# Patient Record
Sex: Female | Born: 1955 | Race: White | Hispanic: No | Marital: Married | Smoking: Never smoker
Health system: Southern US, Community
[De-identification: ages and names within clinical notes are randomized; demographics above are authoritative.]

## PROBLEM LIST (undated history)

## (undated) DIAGNOSIS — I10 Essential (primary) hypertension: Secondary | ICD-10-CM

## (undated) DIAGNOSIS — R7303 Prediabetes: Secondary | ICD-10-CM

## (undated) DIAGNOSIS — E039 Hypothyroidism, unspecified: Secondary | ICD-10-CM

## (undated) DIAGNOSIS — Z8489 Family history of other specified conditions: Secondary | ICD-10-CM

## (undated) DIAGNOSIS — M1990 Unspecified osteoarthritis, unspecified site: Secondary | ICD-10-CM

## (undated) DIAGNOSIS — E785 Hyperlipidemia, unspecified: Secondary | ICD-10-CM

## (undated) DIAGNOSIS — D649 Anemia, unspecified: Secondary | ICD-10-CM

## (undated) HISTORY — PX: BREAST EXCISIONAL BIOPSY: SUR124

## (undated) HISTORY — PX: ADENOIDECTOMY: SUR15

## (undated) HISTORY — PX: WISDOM TOOTH EXTRACTION: SHX21

## (undated) HISTORY — PX: BREAST BIOPSY: SHX20

## (undated) HISTORY — DX: Hyperlipidemia, unspecified: E78.5

## (undated) HISTORY — PX: POLYPECTOMY: SHX149

## (undated) MED FILL — Iron Sucrose Inj 20 MG/ML (Fe Equiv): INTRAVENOUS | Qty: 10 | Status: AC

---
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
## 1993-04-15 HISTORY — PX: TUBAL LIGATION: SHX77

## 1997-11-08 ENCOUNTER — Other Ambulatory Visit: Admission: RE | Admit: 1997-11-08 | Discharge: 1997-11-08 | Payer: Self-pay

## 1999-03-22 ENCOUNTER — Encounter: Admission: RE | Admit: 1999-03-22 | Discharge: 1999-03-22 | Payer: Self-pay | Admitting: Obstetrics and Gynecology

## 1999-03-22 ENCOUNTER — Encounter: Payer: Self-pay | Admitting: Obstetrics and Gynecology

## 2000-06-27 ENCOUNTER — Encounter: Admission: RE | Admit: 2000-06-27 | Discharge: 2000-06-27 | Payer: Self-pay | Admitting: Obstetrics and Gynecology

## 2000-06-27 ENCOUNTER — Encounter: Payer: Self-pay | Admitting: Obstetrics and Gynecology

## 2000-07-02 ENCOUNTER — Encounter: Payer: Self-pay | Admitting: Obstetrics and Gynecology

## 2000-07-02 ENCOUNTER — Encounter: Admission: RE | Admit: 2000-07-02 | Discharge: 2000-07-02 | Payer: Self-pay | Admitting: Obstetrics and Gynecology

## 2002-04-05 ENCOUNTER — Encounter: Admission: RE | Admit: 2002-04-05 | Discharge: 2002-04-05 | Payer: Self-pay | Admitting: Obstetrics and Gynecology

## 2002-04-05 ENCOUNTER — Encounter: Payer: Self-pay | Admitting: Obstetrics and Gynecology

## 2003-04-18 ENCOUNTER — Encounter: Admission: RE | Admit: 2003-04-18 | Discharge: 2003-04-18 | Payer: Self-pay | Admitting: Obstetrics and Gynecology

## 2004-08-01 ENCOUNTER — Ambulatory Visit (HOSPITAL_COMMUNITY): Admission: RE | Admit: 2004-08-01 | Discharge: 2004-08-01 | Payer: Self-pay | Admitting: Obstetrics and Gynecology

## 2005-10-22 ENCOUNTER — Ambulatory Visit (HOSPITAL_COMMUNITY): Admission: RE | Admit: 2005-10-22 | Discharge: 2005-10-22 | Payer: Self-pay | Admitting: Obstetrics and Gynecology

## 2007-01-16 ENCOUNTER — Ambulatory Visit (HOSPITAL_COMMUNITY): Admission: RE | Admit: 2007-01-16 | Discharge: 2007-01-16 | Payer: Self-pay | Admitting: Obstetrics and Gynecology

## 2008-02-05 ENCOUNTER — Ambulatory Visit (HOSPITAL_COMMUNITY): Admission: RE | Admit: 2008-02-05 | Discharge: 2008-02-05 | Payer: Self-pay | Admitting: Family Medicine

## 2009-03-02 ENCOUNTER — Ambulatory Visit (HOSPITAL_COMMUNITY): Admission: RE | Admit: 2009-03-02 | Discharge: 2009-03-02 | Payer: Self-pay | Admitting: Family Medicine

## 2010-04-03 ENCOUNTER — Ambulatory Visit (HOSPITAL_COMMUNITY)
Admission: RE | Admit: 2010-04-03 | Discharge: 2010-04-03 | Payer: Self-pay | Source: Home / Self Care | Attending: Family Medicine | Admitting: Family Medicine

## 2010-05-06 ENCOUNTER — Encounter: Payer: Self-pay | Admitting: Obstetrics and Gynecology

## 2011-04-15 ENCOUNTER — Other Ambulatory Visit (HOSPITAL_COMMUNITY): Payer: Self-pay | Admitting: Family Medicine

## 2011-04-15 DIAGNOSIS — Z1231 Encounter for screening mammogram for malignant neoplasm of breast: Secondary | ICD-10-CM

## 2011-05-14 ENCOUNTER — Ambulatory Visit (HOSPITAL_COMMUNITY)
Admission: RE | Admit: 2011-05-14 | Discharge: 2011-05-14 | Disposition: A | Discharge status: Home / Self Care | Payer: BC Managed Care – PPO | Source: Ambulatory Visit | Attending: Family Medicine | Admitting: Family Medicine

## 2011-05-14 DIAGNOSIS — Z1231 Encounter for screening mammogram for malignant neoplasm of breast: Secondary | ICD-10-CM | POA: Insufficient documentation

## 2012-04-29 ENCOUNTER — Other Ambulatory Visit (HOSPITAL_COMMUNITY): Payer: Self-pay | Admitting: Family Medicine

## 2012-04-29 DIAGNOSIS — Z1231 Encounter for screening mammogram for malignant neoplasm of breast: Secondary | ICD-10-CM

## 2012-05-28 ENCOUNTER — Ambulatory Visit (HOSPITAL_COMMUNITY): Payer: BC Managed Care – PPO

## 2012-07-27 ENCOUNTER — Ambulatory Visit (HOSPITAL_COMMUNITY)
Admission: RE | Admit: 2012-07-27 | Discharge: 2012-07-27 | Disposition: A | Discharge status: Home / Self Care | Payer: BC Managed Care – PPO | Source: Ambulatory Visit | Attending: Family Medicine | Admitting: Family Medicine

## 2012-07-27 DIAGNOSIS — Z1231 Encounter for screening mammogram for malignant neoplasm of breast: Secondary | ICD-10-CM | POA: Insufficient documentation

## 2012-12-30 ENCOUNTER — Other Ambulatory Visit (HOSPITAL_COMMUNITY): Payer: Self-pay | Admitting: Family Medicine

## 2012-12-30 DIAGNOSIS — Z780 Asymptomatic menopausal state: Secondary | ICD-10-CM

## 2013-02-05 ENCOUNTER — Ambulatory Visit (HOSPITAL_COMMUNITY)
Admission: RE | Admit: 2013-02-05 | Discharge: 2013-02-05 | Disposition: A | Discharge status: Home / Self Care | Payer: BC Managed Care – PPO | Source: Ambulatory Visit | Attending: Family Medicine | Admitting: Family Medicine

## 2013-02-05 ENCOUNTER — Ambulatory Visit (HOSPITAL_COMMUNITY): Payer: BC Managed Care – PPO

## 2013-02-05 DIAGNOSIS — Z780 Asymptomatic menopausal state: Secondary | ICD-10-CM | POA: Insufficient documentation

## 2013-02-05 DIAGNOSIS — Z13820 Encounter for screening for osteoporosis: Secondary | ICD-10-CM | POA: Insufficient documentation

## 2013-08-03 ENCOUNTER — Other Ambulatory Visit (HOSPITAL_COMMUNITY): Payer: Self-pay | Admitting: Family Medicine

## 2013-08-03 DIAGNOSIS — Z1231 Encounter for screening mammogram for malignant neoplasm of breast: Secondary | ICD-10-CM

## 2013-08-19 ENCOUNTER — Ambulatory Visit (HOSPITAL_COMMUNITY)
Admission: RE | Admit: 2013-08-19 | Discharge: 2013-08-19 | Disposition: A | Discharge status: Home / Self Care | Payer: BC Managed Care – PPO | Source: Ambulatory Visit | Attending: Family Medicine | Admitting: Family Medicine

## 2013-08-19 DIAGNOSIS — Z1231 Encounter for screening mammogram for malignant neoplasm of breast: Secondary | ICD-10-CM

## 2014-03-15 HISTORY — PX: OTHER SURGICAL HISTORY: SHX169

## 2014-10-05 ENCOUNTER — Other Ambulatory Visit (HOSPITAL_COMMUNITY): Payer: Self-pay | Admitting: Family Medicine

## 2014-10-05 DIAGNOSIS — Z1231 Encounter for screening mammogram for malignant neoplasm of breast: Secondary | ICD-10-CM

## 2014-10-10 ENCOUNTER — Ambulatory Visit (HOSPITAL_COMMUNITY)
Admission: RE | Admit: 2014-10-10 | Discharge: 2014-10-10 | Disposition: A | Discharge status: Home / Self Care | Payer: BC Managed Care – PPO | Source: Ambulatory Visit | Attending: Family Medicine | Admitting: Family Medicine

## 2014-10-10 DIAGNOSIS — Z1231 Encounter for screening mammogram for malignant neoplasm of breast: Secondary | ICD-10-CM | POA: Insufficient documentation

## 2015-03-23 ENCOUNTER — Other Ambulatory Visit (HOSPITAL_COMMUNITY): Payer: Self-pay | Admitting: Orthopaedic Surgery

## 2015-03-23 ENCOUNTER — Other Ambulatory Visit (HOSPITAL_COMMUNITY): Payer: Self-pay | Admitting: *Deleted

## 2015-03-23 NOTE — Patient Instructions (Addendum)
Sophia MassyDebra L Adams  03/23/2015   Your procedure is scheduled on: 03-31-15  [facility]  [facility] to 3rd floor to  Short Stay Center at 530 AM.  Call this number if you have problems the morning of surgery [phone]   Remember: ONLY 1 PERSON MAY GO WITH YOU TO SHORT STAY TO GET  READY MORNING OF YOUR SURGERY.  Do not eat food or drink liquids :After Midnight.     Take these medicines the morning of surgery with A SIP OF WATER: Synthroid                               You may not have any metal on your body including hair pins and              piercings  Do not wear jewelry, make-up, lotions, powders or perfumes, deodorant             Do not wear nail polish.  Do not shave  48 hours prior to surgery.              Men may shave face and neck.   Do not bring valuables to the hospital.  IS NOT             RESPONSIBLE   FOR VALUABLES.  Contacts, dentures or bridgework may not be worn into surgery.  Leave suitcase in the car. After surgery it may be brought to your room.      _____________________________________________________________________             [facility] Pre[facility] Before surgery, you can play an important role.  Because skin is not sterile, your skin needs to be as free of germs as possible.  You can reduce the number of germs on your skin by washing with CHG (chlorahexidine gluconate) soap before surgery.  CHG is an antiseptic cleaner which kills germs and bonds with the skin to continue killing germs even after washing. Please DO NOT use if you have an allergy to CHG or antibacterial soaps.  If your skin becomes reddened/irritated stop using the CHG and inform your nurse when you arrive at Short Stay. Do not shave (including legs and underarms) for at least 48 hours prior to the first CHG shower.  You may shave your face/neck. Please follow these instructions carefully:  1.  Shower with CHG  Soap the night before surgery and the  morning of Surgery.  2.  If you choose to wash your hair, wash your hair first as usual with your  normal  shampoo.  3.  After you shampoo, rinse your hair and body thoroughly to remove the  shampoo.                           4.  Use CHG as you would any other liquid soap.  You can apply chg directly  to the skin and wash                       Gently with a scrungie or clean washcloth.  5.  Apply the CHG Soap to your body ONLY FROM THE NECK DOWN.   Do not use on face/ open  Wound or open sores. Avoid contact with eyes, ears mouth and genitals (private parts).                       Wash face,  Genitals (private parts) with your normal soap.             6.  Wash thoroughly, paying special attention to the area where your surgery  will be performed.  7.  Thoroughly rinse your body with warm water from the neck down.  8.  DO NOT shower/wash with your normal soap after using and rinsing off  the CHG Soap.                9.  Pat yourself dry with a clean towel.            10.  Wear clean pajamas.            11.  Place clean sheets on your bed the night of your first shower and do not  sleep with pets. Day of Surgery : Do not apply any lotions/deodorants the morning of surgery.  Please wear clean clothes to the hospital/surgery center.  FAILURE TO FOLLOW THESE INSTRUCTIONS MAY RESULT IN THE CANCELLATION OF YOUR SURGERY PATIENT SIGNATURE_________________________________  NURSE SIGNATURE__________________________________  ________________________________________________________________________

## 2015-03-27 ENCOUNTER — Encounter (HOSPITAL_COMMUNITY)
Admission: RE | Admit: 2015-03-27 | Discharge: 2015-03-27 | Disposition: A | Discharge status: Home / Self Care | Payer: BC Managed Care – PPO | Source: Ambulatory Visit | Attending: Orthopaedic Surgery | Admitting: Orthopaedic Surgery

## 2015-03-27 ENCOUNTER — Encounter (HOSPITAL_COMMUNITY): Payer: Self-pay

## 2015-03-27 DIAGNOSIS — Z01812 Encounter for preprocedural laboratory examination: Secondary | ICD-10-CM | POA: Diagnosis not present

## 2015-03-27 HISTORY — DX: Unspecified osteoarthritis, unspecified site: M19.90

## 2015-03-27 HISTORY — DX: Family history of other specified conditions: Z84.89

## 2015-03-27 HISTORY — DX: Anemia, unspecified: D64.9

## 2015-03-27 HISTORY — DX: Hypothyroidism, unspecified: E03.9

## 2015-03-27 LAB — CBC
HEMATOCRIT: 37.8 % (ref 36.0–46.0)
Hemoglobin: 12.6 g/dL (ref 12.0–15.0)
MCH: 28.9 pg (ref 26.0–34.0)
MCHC: 33.3 g/dL (ref 30.0–36.0)
MCV: 86.7 fL (ref 78.0–100.0)
PLATELETS: 280 10*3/uL (ref 150–400)
RBC: 4.36 MIL/uL (ref 3.87–5.11)
RDW: 12.3 % (ref 11.5–15.5)
WBC: 6.2 10*3/uL (ref 4.0–10.5)

## 2015-03-27 LAB — TYPE AND SCREEN
ABO/RH(D): A NEG
Antibody Screen: NEGATIVE

## 2015-03-27 LAB — BASIC METABOLIC PANEL
ANION GAP: 8 (ref 5–15)
BUN: 16 mg/dL (ref 6–20)
CALCIUM: 9.7 mg/dL (ref 8.9–10.3)
CHLORIDE: 108 mmol/L (ref 101–111)
CO2: 26 mmol/L (ref 22–32)
Creatinine, Ser: 0.79 mg/dL (ref 0.44–1.00)
GFR calc non Af Amer: >60 mL/min (ref >60)
Glucose, Bld: 98 mg/dL (ref 65–99)
POTASSIUM: 4.3 mmol/L (ref 3.5–5.1)
Sodium: 142 mmol/L (ref 135–145)

## 2015-03-27 LAB — ABO/RH: ABO/RH(D): A NEG

## 2015-03-27 LAB — SURGICAL PCR SCREEN
MRSA, PCR: NEGATIVE
Staphylococcus aureus: NEGATIVE

## 2015-03-31 ENCOUNTER — Observation Stay (HOSPITAL_COMMUNITY)
Admission: RE | Admit: 2015-03-31 | Discharge: 2015-04-01 | Disposition: A | Discharge status: Home / Self Care | Payer: BC Managed Care – PPO | Source: Ambulatory Visit | Attending: Orthopaedic Surgery | Admitting: Orthopaedic Surgery

## 2015-03-31 ENCOUNTER — Ambulatory Visit (HOSPITAL_COMMUNITY): Payer: BC Managed Care – PPO | Admitting: Certified Registered Nurse Anesthetist

## 2015-03-31 ENCOUNTER — Encounter (HOSPITAL_COMMUNITY): Payer: Self-pay | Admitting: *Deleted

## 2015-03-31 ENCOUNTER — Encounter (HOSPITAL_COMMUNITY)
Admission: RE | Disposition: A | Discharge status: Home / Self Care | Payer: Self-pay | Source: Ambulatory Visit | Attending: Orthopaedic Surgery

## 2015-03-31 ENCOUNTER — Ambulatory Visit (HOSPITAL_COMMUNITY): Payer: BC Managed Care – PPO

## 2015-03-31 DIAGNOSIS — M21611 Bunion of right foot: Secondary | ICD-10-CM | POA: Insufficient documentation

## 2015-03-31 DIAGNOSIS — M2061 Acquired deformities of toe(s), unspecified, right foot: Secondary | ICD-10-CM | POA: Diagnosis not present

## 2015-03-31 DIAGNOSIS — M1711 Unilateral primary osteoarthritis, right knee: Secondary | ICD-10-CM | POA: Diagnosis not present

## 2015-03-31 DIAGNOSIS — M899 Disorder of bone, unspecified: Secondary | ICD-10-CM | POA: Insufficient documentation

## 2015-03-31 DIAGNOSIS — Z791 Long term (current) use of non-steroidal anti-inflammatories (NSAID): Secondary | ICD-10-CM | POA: Insufficient documentation

## 2015-03-31 DIAGNOSIS — Z96651 Presence of right artificial knee joint: Secondary | ICD-10-CM

## 2015-03-31 DIAGNOSIS — E039 Hypothyroidism, unspecified: Secondary | ICD-10-CM | POA: Insufficient documentation

## 2015-03-31 DIAGNOSIS — M25561 Pain in right knee: Secondary | ICD-10-CM | POA: Diagnosis present

## 2015-03-31 DIAGNOSIS — Z79899 Other long term (current) drug therapy: Secondary | ICD-10-CM | POA: Diagnosis not present

## 2015-03-31 DIAGNOSIS — M25761 Osteophyte, right knee: Secondary | ICD-10-CM | POA: Insufficient documentation

## 2015-03-31 HISTORY — PX: PARTIAL KNEE ARTHROPLASTY: SHX2174

## 2015-03-31 HISTORY — PX: BONE EXOSTOSIS EXCISION: SHX1249

## 2015-03-31 SURGERY — ARTHROPLASTY, KNEE, UNICOMPARTMENTAL
Anesthesia: Monitor Anesthesia Care | Site: Knee | Laterality: Right

## 2015-03-31 MED ORDER — RIVAROXABAN 10 MG PO TABS
10.0000 mg | ORAL_TABLET | Freq: Every day | ORAL | Status: DC
  Administered 2015-04-01: 10 mg via ORAL
  Filled 2015-03-31 (×2): qty 1

## 2015-03-31 MED ORDER — PROPOFOL 10 MG/ML IV BOLUS
INTRAVENOUS | Status: AC
  Filled 2015-03-31: qty 20

## 2015-03-31 MED ORDER — ATROPINE SULFATE 0.4 MG/ML IJ SOLN
INTRAMUSCULAR | Status: AC
  Filled 2015-03-31: qty 1

## 2015-03-31 MED ORDER — DIPHENHYDRAMINE HCL 12.5 MG/5ML PO ELIX: 12.5000 mg | ORAL_SOLUTION | ORAL | Status: DC | PRN

## 2015-03-31 MED ORDER — ACETAMINOPHEN 650 MG RE SUPP: 650.0000 mg | Freq: Four times a day (QID) | RECTAL | Status: DC | PRN

## 2015-03-31 MED ORDER — 0.9 % SODIUM CHLORIDE (POUR BTL) OPTIME
TOPICAL | Status: DC | PRN
  Administered 2015-03-31: 1000 mL

## 2015-03-31 MED ORDER — HYDROMORPHONE HCL 1 MG/ML IJ SOLN
1.0000 mg | INTRAMUSCULAR | Status: DC | PRN
  Administered 2015-03-31: 1 mg via INTRAVENOUS
  Filled 2015-03-31: qty 1

## 2015-03-31 MED ORDER — HYDROMORPHONE HCL 1 MG/ML IJ SOLN
INTRAMUSCULAR | Status: AC
  Filled 2015-03-31: qty 1

## 2015-03-31 MED ORDER — OXYCODONE HCL 5 MG PO TABS
5.0000 mg | ORAL_TABLET | ORAL | Status: DC | PRN
  Administered 2015-03-31: 5 mg via ORAL
  Administered 2015-03-31: 10 mg via ORAL
  Administered 2015-03-31: 5 mg via ORAL
  Administered 2015-03-31: 10 mg via ORAL
  Administered 2015-04-01: 5 mg via ORAL
  Administered 2015-04-01 (×2): 10 mg via ORAL
  Filled 2015-03-31: qty 2
  Filled 2015-03-31: qty 1
  Filled 2015-03-31: qty 2
  Filled 2015-03-31: qty 1
  Filled 2015-03-31: qty 2
  Filled 2015-03-31: qty 1
  Filled 2015-03-31: qty 2

## 2015-03-31 MED ORDER — METHOCARBAMOL 500 MG PO TABS
500.0000 mg | ORAL_TABLET | Freq: Four times a day (QID) | ORAL | Status: DC | PRN
  Administered 2015-03-31: 500 mg via ORAL
  Filled 2015-03-31 (×2): qty 1

## 2015-03-31 MED ORDER — CEFAZOLIN SODIUM 1-5 GM-% IV SOLN
1.0000 g | Freq: Four times a day (QID) | INTRAVENOUS | Status: AC
  Administered 2015-03-31 (×2): 1 g via INTRAVENOUS
  Filled 2015-03-31 (×2): qty 50

## 2015-03-31 MED ORDER — ALUM & MAG HYDROXIDE-SIMETH 200-200-20 MG/5ML PO SUSP: 30.0000 mL | ORAL | Status: DC | PRN

## 2015-03-31 MED ORDER — OXYCODONE HCL 5 MG/5ML PO SOLN
5.0000 mg | Freq: Once | ORAL | Status: DC | PRN
  Filled 2015-03-31: qty 5

## 2015-03-31 MED ORDER — CEFAZOLIN SODIUM-DEXTROSE 2-3 GM-% IV SOLR
2.0000 g | INTRAVENOUS | Status: AC
  Administered 2015-03-31: 2 g via INTRAVENOUS

## 2015-03-31 MED ORDER — ONDANSETRON HCL 4 MG/2ML IJ SOLN: 4.0000 mg | Freq: Four times a day (QID) | INTRAMUSCULAR | Status: DC | PRN

## 2015-03-31 MED ORDER — PROPOFOL 500 MG/50ML IV EMUL
INTRAVENOUS | Status: DC | PRN
  Administered 2015-03-31: 50 ug/kg/min via INTRAVENOUS

## 2015-03-31 MED ORDER — OXYCODONE HCL 5 MG PO TABS: 5.0000 mg | ORAL_TABLET | Freq: Once | ORAL | Status: DC | PRN

## 2015-03-31 MED ORDER — ONDANSETRON HCL 4 MG PO TABS: 4.0000 mg | ORAL_TABLET | Freq: Four times a day (QID) | ORAL | Status: DC | PRN

## 2015-03-31 MED ORDER — SODIUM CHLORIDE 0.9 % IV SOLN
INTRAVENOUS | Status: DC
  Administered 2015-03-31 – 2015-04-01 (×2): via INTRAVENOUS

## 2015-03-31 MED ORDER — ACETAMINOPHEN 325 MG PO TABS: 650.0000 mg | ORAL_TABLET | Freq: Four times a day (QID) | ORAL | Status: DC | PRN

## 2015-03-31 MED ORDER — ONDANSETRON HCL 4 MG/2ML IJ SOLN
INTRAMUSCULAR | Status: AC
  Filled 2015-03-31: qty 2

## 2015-03-31 MED ORDER — BUPIVACAINE IN DEXTROSE 0.75-8.25 % IT SOLN
INTRATHECAL | Status: DC | PRN
  Administered 2015-03-31: 1.8 mL via INTRATHECAL

## 2015-03-31 MED ORDER — ONDANSETRON HCL 4 MG/2ML IJ SOLN
4.0000 mg | Freq: Four times a day (QID) | INTRAMUSCULAR | Status: DC | PRN
  Administered 2015-04-01: 4 mg via INTRAVENOUS
  Filled 2015-03-31: qty 2

## 2015-03-31 MED ORDER — SODIUM CHLORIDE 0.9 % IJ SOLN
INTRAMUSCULAR | Status: AC
  Filled 2015-03-31: qty 10

## 2015-03-31 MED ORDER — RIVAROXABAN 10 MG PO TABS: 10.0000 mg | ORAL_TABLET | Freq: Every day | ORAL | Status: DC

## 2015-03-31 MED ORDER — SODIUM CHLORIDE 0.9 % IR SOLN
Status: DC | PRN
  Administered 2015-03-31: 2000 mL

## 2015-03-31 MED ORDER — CEFAZOLIN SODIUM-DEXTROSE 2-3 GM-% IV SOLR
INTRAVENOUS | Status: AC
  Filled 2015-03-31: qty 50

## 2015-03-31 MED ORDER — METHOCARBAMOL 1000 MG/10ML IJ SOLN
500.0000 mg | Freq: Four times a day (QID) | INTRAVENOUS | Status: DC | PRN
  Administered 2015-03-31: 500 mg via INTRAVENOUS
  Filled 2015-03-31 (×2): qty 5

## 2015-03-31 MED ORDER — DEXAMETHASONE SODIUM PHOSPHATE 10 MG/ML IJ SOLN
INTRAMUSCULAR | Status: AC
  Filled 2015-03-31: qty 1

## 2015-03-31 MED ORDER — FENTANYL CITRATE (PF) 100 MCG/2ML IJ SOLN
INTRAMUSCULAR | Status: DC | PRN
  Administered 2015-03-31 (×2): 50 ug via INTRAVENOUS

## 2015-03-31 MED ORDER — PHENOL 1.4 % MT LIQD
1.0000 | OROMUCOSAL | Status: DC | PRN
  Filled 2015-03-31: qty 177

## 2015-03-31 MED ORDER — LACTATED RINGERS IV SOLN: INTRAVENOUS | Status: DC

## 2015-03-31 MED ORDER — METOCLOPRAMIDE HCL 5 MG/ML IJ SOLN: 5.0000 mg | Freq: Three times a day (TID) | INTRAMUSCULAR | Status: DC | PRN

## 2015-03-31 MED ORDER — METOCLOPRAMIDE HCL 10 MG PO TABS: 5.0000 mg | ORAL_TABLET | Freq: Three times a day (TID) | ORAL | Status: DC | PRN

## 2015-03-31 MED ORDER — EPHEDRINE SULFATE 50 MG/ML IJ SOLN
INTRAMUSCULAR | Status: AC
  Filled 2015-03-31: qty 1

## 2015-03-31 MED ORDER — DEXAMETHASONE SODIUM PHOSPHATE 10 MG/ML IJ SOLN
INTRAMUSCULAR | Status: DC | PRN
  Administered 2015-03-31: 10 mg via INTRAVENOUS

## 2015-03-31 MED ORDER — LIDOCAINE HCL (CARDIAC) 20 MG/ML IV SOLN
INTRAVENOUS | Status: DC | PRN
  Administered 2015-03-31: 40 mg via INTRAVENOUS

## 2015-03-31 MED ORDER — FENTANYL CITRATE (PF) 100 MCG/2ML IJ SOLN
INTRAMUSCULAR | Status: AC
  Filled 2015-03-31: qty 2

## 2015-03-31 MED ORDER — OXYCODONE-ACETAMINOPHEN 5-325 MG PO TABS: 1.0000 | ORAL_TABLET | ORAL | Status: DC | PRN

## 2015-03-31 MED ORDER — KETOROLAC TROMETHAMINE 15 MG/ML IJ SOLN
7.5000 mg | Freq: Four times a day (QID) | INTRAMUSCULAR | Status: AC
  Administered 2015-03-31 – 2015-04-01 (×4): 7.5 mg via INTRAVENOUS
  Filled 2015-03-31 (×4): qty 1

## 2015-03-31 MED ORDER — LACTATED RINGERS IV SOLN
INTRAVENOUS | Status: DC | PRN
  Administered 2015-03-31 (×3): via INTRAVENOUS

## 2015-03-31 MED ORDER — MIDAZOLAM HCL 5 MG/5ML IJ SOLN
INTRAMUSCULAR | Status: DC | PRN
  Administered 2015-03-31: 2 mg via INTRAVENOUS

## 2015-03-31 MED ORDER — MIDAZOLAM HCL 2 MG/2ML IJ SOLN
INTRAMUSCULAR | Status: AC
  Filled 2015-03-31: qty 2

## 2015-03-31 MED ORDER — HYDROMORPHONE HCL 1 MG/ML IJ SOLN
0.2500 mg | INTRAMUSCULAR | Status: DC | PRN
  Administered 2015-03-31 (×4): 0.5 mg via INTRAVENOUS

## 2015-03-31 MED ORDER — LEVOTHYROXINE SODIUM 88 MCG PO TABS
88.0000 ug | ORAL_TABLET | Freq: Every day | ORAL | Status: DC
  Administered 2015-04-01: 88 ug via ORAL
  Filled 2015-03-31 (×2): qty 1

## 2015-03-31 MED ORDER — PROPOFOL 10 MG/ML IV BOLUS
INTRAVENOUS | Status: DC | PRN
  Administered 2015-03-31: 10 mg via INTRAVENOUS
  Administered 2015-03-31: 20 mg via INTRAVENOUS
  Administered 2015-03-31 (×2): 10 mg via INTRAVENOUS
  Administered 2015-03-31: 20 mg via INTRAVENOUS
  Administered 2015-03-31 (×2): 10 mg via INTRAVENOUS

## 2015-03-31 MED ORDER — METHOCARBAMOL 500 MG PO TABS: 500.0000 mg | ORAL_TABLET | Freq: Four times a day (QID) | ORAL | Status: DC | PRN

## 2015-03-31 MED ORDER — ONDANSETRON HCL 4 MG/2ML IJ SOLN
INTRAMUSCULAR | Status: DC | PRN
  Administered 2015-03-31: 4 mg via INTRAVENOUS

## 2015-03-31 MED ORDER — MENTHOL 3 MG MT LOZG: 1.0000 | LOZENGE | OROMUCOSAL | Status: DC | PRN

## 2015-03-31 MED ORDER — PROPOFOL 10 MG/ML IV BOLUS
INTRAVENOUS | Status: AC
  Filled 2015-03-31: qty 60

## 2015-03-31 SURGICAL SUPPLY — 53 items
BAG ZIPLOCK 12X15 (MISCELLANEOUS) IMPLANT
BANDAGE ACE 4X5 VEL STRL LF (GAUZE/BANDAGES/DRESSINGS) ×4 IMPLANT
BANDAGE ELASTIC 4 VELCRO ST LF (GAUZE/BANDAGES/DRESSINGS) ×4 IMPLANT
BANDAGE ELASTIC 6 VELCRO ST LF (GAUZE/BANDAGES/DRESSINGS) ×4 IMPLANT
BENZOIN TINCTURE PRP APPL 2/3 (GAUZE/BANDAGES/DRESSINGS) ×4 IMPLANT
BLADE 10 SAFETY STRL DISP (BLADE) ×4 IMPLANT
BLADE SAW RECIPROCATING 77.5 (BLADE) ×4 IMPLANT
BLADE SAW SGTL 13.0X1.19X90.0M (BLADE) ×4 IMPLANT
BNDG CONFORM 2 STRL LF (GAUZE/BANDAGES/DRESSINGS) ×4 IMPLANT
BNDG GAUZE ELAST 4 BULKY (GAUZE/BANDAGES/DRESSINGS) ×4 IMPLANT
BONE CEMENT PALACOSE (Orthopedic Implant) ×4 IMPLANT
BOWL SMART MIX CTS (DISPOSABLE) ×4 IMPLANT
CAPT KNEE PARTIAL 2 ×4 IMPLANT
CEMENT BONE PALACOSE (Orthopedic Implant) ×2 IMPLANT
CLOSURE WOUND 1/2 X4 (GAUZE/BANDAGES/DRESSINGS) ×1
CLOTH BEACON ORANGE TIMEOUT ST (SAFETY) ×4 IMPLANT
CUFF TOURN SGL QUICK 34 (TOURNIQUET CUFF) ×2
CUFF TRNQT CYL 34X4X40X1 (TOURNIQUET CUFF) ×2 IMPLANT
DRAPE EXTREMITY T 121X128X90 (DRAPE) ×4 IMPLANT
DRAPE U-SHAPE 47X51 STRL (DRAPES) ×4 IMPLANT
DRSG AQUACEL AG ADV 3.5X10 (GAUZE/BANDAGES/DRESSINGS) IMPLANT
DRSG PAD ABDOMINAL 8X10 ST (GAUZE/BANDAGES/DRESSINGS) ×8 IMPLANT
DURAPREP 26ML APPLICATOR (WOUND CARE) ×4 IMPLANT
ELECT PENCIL ROCKER SW 15FT (MISCELLANEOUS) ×4 IMPLANT
ELECT REM PT RETURN 9FT ADLT (ELECTROSURGICAL) ×4
ELECTRODE REM PT RTRN 9FT ADLT (ELECTROSURGICAL) ×2 IMPLANT
GAUZE SPONGE 4X4 12PLY STRL (GAUZE/BANDAGES/DRESSINGS) ×4 IMPLANT
GAUZE XEROFORM 1X8 LF (GAUZE/BANDAGES/DRESSINGS) ×4 IMPLANT
GLOVE BIO SURGEON STRL SZ7.5 (GLOVE) ×4 IMPLANT
GLOVE BIOGEL PI IND STRL 8 (GLOVE) ×8 IMPLANT
GLOVE BIOGEL PI INDICATOR 8 (GLOVE) ×8
GLOVE ECLIPSE 7.5 STRL STRAW (GLOVE) ×4 IMPLANT
GLOVE ECLIPSE 8.0 STRL XLNG CF (GLOVE) ×12 IMPLANT
GOWN STRL REUS W/TWL XL LVL3 (GOWN DISPOSABLE) ×8 IMPLANT
HANDPIECE INTERPULSE COAX TIP (DISPOSABLE) ×2
LEGGING LITHOTOMY PAIR STRL (DRAPES) ×4 IMPLANT
MANIFOLD NEPTUNE II (INSTRUMENTS) ×4 IMPLANT
PACK BLADE SAW RECIP 70 3 PT (BLADE) ×4 IMPLANT
PACK TOTAL KNEE CUSTOM (KITS) ×4 IMPLANT
PADDING CAST COTTON 6X4 STRL (CAST SUPPLIES) ×4 IMPLANT
SET HNDPC FAN SPRY TIP SCT (DISPOSABLE) ×2 IMPLANT
STAPLER VISISTAT 35W (STAPLE) IMPLANT
STRIP CLOSURE SKIN 1/2X4 (GAUZE/BANDAGES/DRESSINGS) ×3 IMPLANT
SUT ETHILON 3 0 PS 1 (SUTURE) ×8 IMPLANT
SUT MNCRL AB 4-0 PS2 18 (SUTURE) ×4 IMPLANT
SUT VIC AB 0 CT1 36 (SUTURE) ×4 IMPLANT
SUT VIC AB 1 CT1 36 (SUTURE) ×4 IMPLANT
SUT VIC AB 2-0 CT1 27 (SUTURE) ×4
SUT VIC AB 2-0 CT1 TAPERPNT 27 (SUTURE) ×4 IMPLANT
TRAY FOLEY W/METER SILVER 14FR (SET/KITS/TRAYS/PACK) ×4 IMPLANT
TRAY FOLEY W/METER SILVER 16FR (SET/KITS/TRAYS/PACK) IMPLANT
WRAP KNEE MAXI GEL POST OP (GAUZE/BANDAGES/DRESSINGS) ×4 IMPLANT
YANKAUER SUCT BULB TIP 10FT TU (MISCELLANEOUS) ×4 IMPLANT

## 2015-03-31 NOTE — Anesthesia Procedure Notes (Signed)
Spinal Patient location during procedure: OR Start time: 03/31/2015 7:32 AM End time: 03/31/2015 7:35 AM Staffing Anesthesiologist: Marcie Bal, ADAM Resident/CRNA: Darlys Gales R Performed by: resident/CRNA  Preanesthetic Checklist Completed: patient identified, site marked, surgical consent, pre-op evaluation, timeout performed, IV checked, risks and benefits discussed and monitors and equipment checked Spinal Block Patient position: sitting Prep: Betadine Patient monitoring: heart rate, continuous pulse ox and blood pressure Approach: midline Location: L3-4 Injection technique: single-shot Needle Needle type: Spinocan  Needle gauge: 24 G Needle length: 9 cm Needle insertion depth: 7 cm Assessment Sensory level: T6 Additional Notes Expiration date of kit checked and confirmed. Patient tolerated procedure well, without complications.

## 2015-03-31 NOTE — H&P (Signed)
TOTAL KNEE ADMISSION H&P  Patient is being admitted for right partial knee arthroplasty.  Subjective:  Chief Complaint:right knee pain.  HPI: Sophia Adams, 59 y.o. female, has a history of pain and functional disability in the right knee due to arthritis and has failed non-surgical conservative treatments for greater than 12 weeks to includeNSAID's and/or analgesics, corticosteriod injections, viscosupplementation injections, flexibility and strengthening excercises and activity modification.  Onset of symptoms was gradual, starting 5 years ago with gradually worsening course since that time. The patient noted prior procedures on the knee to include  arthroscopy on the right knee(s).  Patient currently rates pain in the right knee(s) at 9 out of 10 with activity. Patient has night pain, worsening of pain with activity and weight bearing, pain that interferes with activities of daily living, pain with passive range of motion, crepitus and joint swelling.  Patient has evidence of subchondral sclerosis, periarticular osteophytes and joint space narrowing by imaging studies. There is no active infection.  Patient Active Problem List   Diagnosis Date Noted  . Osteoarthritis of right knee medial compartment 03/31/2015   Past Medical History  Diagnosis Date  . Hypothyroidism   . Arthritis     oa  . Anemia 21 yrs ago  . Family history of adverse reaction to anesthesia     both parents get ponv    Past Surgical History  Procedure Laterality Date  . Right knee arthroscopy  dec 2015  . Tubal ligation  1995  . Breast biopsy Left 1990 and 1986    benign  . Tonsillectomy  1965    Prescriptions prior to admission  Medication Sig Dispense Refill Last Dose  . ibuprofen (ADVIL,MOTRIN) 200 MG tablet Take 600 mg by mouth 2 (two) times daily as needed for mild pain.   03/23/2015  . SYNTHROID 88 MCG tablet Take 88 mcg by mouth daily before breakfast.   03/31/2015 at 0445   No Known Allergies  Social  History  Substance Use Topics  . Smoking status: Never Smoker   . Smokeless tobacco: Never Used  . Alcohol Use: No    History reviewed. No pertinent family history.   Review of Systems  Musculoskeletal: Positive for joint pain.  All other systems reviewed and are negative.   Objective:  Physical Exam  Constitutional: She is oriented to person, place, and time. She appears well-developed and well-nourished.  HENT:  Head: Normocephalic and atraumatic.  Eyes: EOM are normal. Pupils are equal, round, and reactive to light.  Neck: Normal range of motion. Neck supple.  Cardiovascular: Normal rate and regular rhythm.   Respiratory: Effort normal and breath sounds normal.  GI: Soft. Bowel sounds are normal.  Musculoskeletal:       Right knee: She exhibits swelling and effusion. Tenderness found. Medial joint line tenderness noted.  Neurological: She is alert and oriented to person, place, and time.  Skin: Skin is warm and dry.  Psychiatric: She has a normal mood and affect.    Vital signs in last 24 hours: Temp:  [98.2 F (36.8 C)] 98.2 F (36.8 C) (12/16 0542) Pulse Rate:  [86] 86 (12/16 0542) Resp:  [16] 16 (12/16 0542) BP: (140)/(78) 140/78 mmHg (12/16 0542) SpO2:  [99 %] 99 % (12/16 0542) Weight:  [87.544 kg (193 lb)] 87.544 kg (193 lb) (12/16 0616)  Labs:   Estimated body mass index is 32.63 kg/(m^2) as calculated from the following:   Height as of this encounter: 5' 4.5" (1.638 m).  Weight as of this encounter: 87.544 kg (193 lb).   Imaging Review Plain radiographs demonstrate severe degenerative joint disease of the right knee medial compartment. The overall alignment ismild varus. The bone quality appears to be excellent for age and reported activity level.  Assessment/Plan:  End stage arthritis, right knee medial compartment  The patient history, physical examination, clinical judgment of the provider and imaging studies are consistent with end stage  degenerative joint disease of the right knee(s) and partial knee arthroplasty is deemed medically necessary. The treatment options including medical management, injection therapy arthroscopy and arthroplasty were discussed at length. The risks and benefits of partial knee arthroplasty were presented and reviewed. The risks due to aseptic loosening, infection, stiffness, patella tracking problems, thromboembolic complications and other imponderables were discussed. The patient acknowledged the explanation, agreed to proceed with the plan and consent was signed. Patient is being admitted for inpatient treatment for surgery, pain control, PT, OT, prophylactic antibiotics, VTE prophylaxis, progressive ambulation and ADL's and discharge planning. The patient is planning to be discharged home with home health services

## 2015-03-31 NOTE — Transfer of Care (Signed)
Immediate Anesthesia Transfer of Care Note  Patient: Sophia Adams  Procedure(s) Performed: Procedure(s): RIGHT UNICOMPARTMENTAL KNEE ARTHROPLASTY (Right) EXOSTOSIS EXCISION RIGHT FOOT (Right)  Patient Location: PACU  Anesthesia Type:Spinal  Level of Consciousness:  sedated, patient cooperative and responds to stimulation  Airway & Oxygen Therapy:Patient Spontanous Breathing and Patient connected to face mask oxgen  Post-op Assessment:  Report given to PACU RN and Post -op Vital signs reviewed and stable  Post vital signs:  Reviewed and stable, L4  Last Vitals:  Filed Vitals:   03/31/15 0542  BP: 140/78  Pulse: 86  Temp: 36.8 C  Resp: 16    Complications: No apparent anesthesia complications

## 2015-03-31 NOTE — Anesthesia Preprocedure Evaluation (Signed)
Anesthesia Evaluation  Patient identified by MRN, date of birth, ID band Patient awake    Reviewed: Allergy & Precautions, NPO status , Patient's Chart, lab work & pertinent test results  Airway Mallampati: II   Neck ROM: full    Dental   Pulmonary neg pulmonary ROS,    breath sounds clear to auscultation       Cardiovascular negative cardio ROS   Rhythm:regular Rate:Normal     Neuro/Psych    GI/Hepatic   Endo/Other  Hypothyroidism obese  Renal/GU      Musculoskeletal  (+) Arthritis ,   Abdominal   Peds  Hematology   Anesthesia Other Findings   Reproductive/Obstetrics                             Anesthesia Physical Anesthesia Plan  ASA: II  Anesthesia Plan: MAC and Spinal   Post-op Pain Management:    Induction: Intravenous  Airway Management Planned: Simple Face Mask  Additional Equipment:   Intra-op Plan:   Post-operative Plan:   Informed Consent: I have reviewed the patients History and Physical, chart, labs and discussed the procedure including the risks, benefits and alternatives for the proposed anesthesia with the patient or authorized representative who has indicated his/her understanding and acceptance.     Plan Discussed with: CRNA, Anesthesiologist and Surgeon  Anesthesia Plan Comments:         Anesthesia Quick Evaluation

## 2015-03-31 NOTE — Brief Op Note (Signed)
03/31/2015  10:01 AM  PATIENT:  Sophia Adams  59 y.o. female  PRE-OPERATIVE DIAGNOSIS:  osteoarthritis right knee medial compartment, Exostosis right foot  POST-OPERATIVE DIAGNOSIS:  osteoarthritis right knee medial compartment, Exostosis right foot  PROCEDURE:  Procedure(s): RIGHT UNICOMPARTMENTAL KNEE ARTHROPLASTY (Right) EXOSTOSIS EXCISION RIGHT FOOT (Right)  SURGEON:  Surgeon(s) and Role:    * Kathryne Hitchhristopher Y Mishel Sans, MD - Primary    * Kathryne Hitchhristopher Y Altovise Wahler, MD - Primary  PHYSICIAN ASSISTANT: Rexene EdisonGil Clark, PA-C  ANESTHESIA:   spinal  EBL:  Total I/O In: 2000 [I.V.:2000] Out: 550 [Urine:500; Blood:50]  BLOOD ADMINISTERED:none  DRAINS: none   LOCAL MEDICATIONS USED:  NONE  SPECIMEN:  No Specimen  DISPOSITION OF SPECIMEN:  N/A  COUNTS:  YES  TOURNIQUET:  * Missing tourniquet times found for documented tourniquets in log:  [id number] *  DICTATION: .Other Dictation: Dictation Number [id number]  PLAN OF CARE: Admit for overnight observation  PATIENT DISPOSITION:  PACU - hemodynamically stable.   Delay start of Pharmacological VTE agent (>24hrs) due to surgical blood loss or risk of bleeding: no

## 2015-03-31 NOTE — Evaluation (Signed)
Physical Therapy Evaluation Patient Details Name: Sophia MassyDebra L Adams MRN: [id number] DOB: 04/26/1955 Today's Date: 03/31/2015   History of Present Illness  Pt is a 59 year old female s/p R unicompartmental knee arthroplasty and exostosis excision R foot  Clinical Impression  Patient is s/p above surgery resulting in functional limitations due to the deficits listed below (see PT Problem List).  Patient will benefit from skilled PT to increase their independence and safety with mobility to allow discharge to the venue listed below.   Pt tolerated mobility well POD #0 and plans to d/c home with spouse.     Follow Up Recommendations Home health PT    Equipment Recommendations  None recommended by PT    Recommendations for Other Services       Precautions / Restrictions Precautions Precautions: Knee Restrictions Other Position/Activity Restrictions: WBAT      Mobility  Bed Mobility Overal bed mobility: Needs Assistance Bed Mobility: Supine to Sit;Sit to Supine     Supine to sit: Min assist Sit to supine: Min guard   General bed mobility comments: verbal cues for technique, assist for R LE over EOB  Transfers Overall transfer level: Needs assistance Equipment used: Rolling walker (2 wheeled) Transfers: Sit to/from Stand Sit to Stand: Min guard         General transfer comment: verbal cues for UE and LE positioning  Ambulation/Gait Ambulation/Gait assistance: Min guard Ambulation Distance (Feet): 40 Feet Assistive device: Rolling walker (2 wheeled) Gait Pattern/deviations: Step-to pattern;Decreased stance time - right;Antalgic     General Gait Details: verbal cues for sequence, RW positioning, step length  Stairs            Wheelchair Mobility    Modified Rankin (Stroke Patients Only)       Balance                                             Pertinent Vitals/Pain Pain Assessment: 0-10 Pain Score: 3  Pain Location: R knee,  foot Pain Descriptors / Indicators: Sore Pain Intervention(s): Limited activity within patient's tolerance;Monitored during session;Premedicated before session;Repositioned;Ice applied    Home Living Family/patient expects to be discharged to:: Private residence Living Arrangements: Spouse/significant other Available Help at Discharge: Family Type of Home: House Home Access: Stairs to enter Entrance Stairs-Rails: Right Entrance Stairs-Number of Steps: 3-4 Home Layout: One level Home Equipment: Environmental consultantWalker - 2 wheels      Prior Function Level of Independence: Independent               Hand Dominance        Extremity/Trunk Assessment               Lower Extremity Assessment: RLE deficits/detail RLE Deficits / Details: unable to perform SLR, fair quad contraction, able to wiggle toes       Communication   Communication: No difficulties  Cognition Arousal/Alertness: Awake/alert Behavior During Therapy: WFL for tasks assessed/performed Overall Cognitive Status: Within Functional Limits for tasks assessed                      General Comments      Exercises        Assessment/Plan    PT Assessment Patient needs continued PT services  PT Diagnosis Difficulty walking;Acute pain   PT Problem List Decreased strength;Decreased range of motion;Decreased mobility;Decreased knowledge of use  of DME;Pain  PT Treatment Interventions Functional mobility training;Stair training;Gait training;DME instruction;Patient/family education;Therapeutic activities;Therapeutic exercise   PT Goals (Current goals can be found in the Care Plan section) Acute Rehab PT Goals PT Goal Formulation: With patient Time For Goal Achievement: 04/05/15 Potential to Achieve Goals: Good    Frequency 7X/week   Barriers to discharge        Co-evaluation               End of Session Equipment Utilized During Treatment: Gait belt Activity Tolerance: Patient tolerated treatment  well Patient left: in bed;with call bell/phone within reach;with family/visitor present      Functional Assessment Tool Used: clinical judgement Functional Limitation: Mobility: Walking and moving around Mobility: Walking and Moving Around Current Status (Z6109): At least 20 percent but less than 40 percent impaired, limited or restricted Mobility: Walking and Moving Around Goal Status [phone]): At least 1 percent but less than 20 percent impaired, limited or restricted    Time: 0981-1914 PT Time Calculation (min) (ACUTE ONLY): 19 min   Charges:   PT Evaluation $Initial PT Evaluation Tier I: 1 Procedure     PT G Codes:   PT G-Codes **NOT FOR INPATIENT CLASS** Functional Assessment Tool Used: clinical judgement Functional Limitation: Mobility: Walking and moving around Mobility: Walking and Moving Around Current Status (N8295): At least 20 percent but less than 40 percent impaired, limited or restricted Mobility: Walking and Moving Around Goal Status [phone]): At least 1 percent but less than 20 percent impaired, limited or restricted    Jill Stopka,KATHrine E 03/31/2015, 4:09 PM Zenovia Jarred, PT, DPT 03/31/2015 Pager: [phone]

## 2015-03-31 NOTE — Op Note (Signed)
NAMEARMONIE, METTLER                 ACCOUNT NO.:  [id number]  MEDICAL RECORD NO.:  [id number]  LOCATION:  1615                         FACILITY:  [facility]  PHYSICIAN:  Vanita Panda. Magnus Ivan, M.D.DATE OF BIRTH:  1956-03-07  DATE OF PROCEDURE:  03/31/2015 DATE OF DISCHARGE:                              OPERATIVE REPORT   PREOPERATIVE DIAGNOSES: 1. Right knee severe medial compartment arthritis. 2. Right great toe bunion deformity with dorsal exostosis.  POSTOPERATIVE DIAGNOSES: 1. Right knee severe medial compartment arthritis. 2. Right great toe bunion deformity with dorsal exostosis.  PROCEDURES: 1. Right unicompartmental partial knee arthroplasty. 2. Removal of exostosis of right great toe.  IMPLANTS:  Biomet Oxford unicompartmental knee with size small femur, size B tibial tray, 6-mm mobile-bearing polyethylene insert.  SURGEON:  Vanita Panda. Magnus Ivan, M.D.  ASSISTANT:  Richardean Canal, PA-C  ANESTHESIA:  Spinal.  BLOOD LOSS:  Less than 100 mL.  TOURNIQUET TIME:  Less than 2 hours.  COMPLICATIONS:  None.  INDICATIONS:  Ms. Salvi is a 59 year old female, well known to me.  I have seen her for many years now and I performed a right knee arthroscopy finding a meniscal tear, but significant cartilage loss on the medial compartment of her knee only.  Her ACL and PCL were intact and she had minimal patellofemoral arthritic changes on pristine lateral compartment.  Her knee has a slight varus deformity and all of her pain has only been medial.  She had recurrent effusions of her knee and we have tried outside the arthroscopy multiple steroid injections, aspirations of her knee and hyaluronic acid.  She has failed all of these that her knee is now affecting her activities of daily living, her quality of life, her mobility all poorly.  At this point, she wished to proceed with the partial knee arthroplasty and this has been recommended to her.  While we were in surgery though  she does have a bony and soft tissue prominence of her great toe, I had a bunion deformity and she like to at least have that remove.  She understands that we would not correct the bunion deformity because I needed to be able to full weight with getting her knee rehab and she agrees with just soft tissue procedure for the great toe.  PROCEDURE DESCRIPTION:  After informed consent was obtained, appropriate right knee was marked and right foot was marked.  She was brought to the operating room, and while she was placed supine on the operating table and inside up for spinal anesthesia, she was then laid back supine and a Foley catheter was placed.  A nonsterile tourniquet was placed around her upper right leg and her right leg was prepped and draped from the thigh down the ankle with DuraPrep and sterile drapes and a sterile stockinette.  We were going to proceed with the knee first, then proceeding to the toe second with re-prepping and draping the toe.  With the bed raised and a well leg holder utilized, then if the table was dropped, there was appropriate padding underneath her left nonoperative leg.  Time-out was called and she was identified as correct patient and correct right knee.  We  then used an Esmarch to wrap out the leg and tourniquet was inflated to 300 mm of pressure.  I then made an incision medial to the patella and carried this down to the tibial plateau.  We found a large joint effusion when we performed our medial patellar arthrotomy and found significant cartilage loss on the medial compartment of her knee with a varus deformity.  The lateral compartment looked normal and there was degenerative changes of the patellofemoral joint, but this was asymptomatic for her.  Her ACL was also intact. First, we proceeded with the tibia.  Using an intramedullary alignment guide, we set our alignment based off a small spoon for the femur for sizing and we made our tibial cut off  without using a sagittal saw and a reciprocating saw.  We then went to the femur and put our femoral alignment sizing guide using intramedullary guide and then an extramedullary guide because we were struggling to do this.  We were able to then made our initial distal femoral cut.  We then went to the femur and went from our sizes.  We chose a size B for the tibia and a small for the femur and we were able to the mill up to have an appropriate flexion and extension gaps with using different polyethylene inserts to get it tighter and tighter until we were pleased with that alignment on both the femur and the tibia.  We removed our posterior osteophytes as well and did our finishing reaming on the femur to remove anterior osteophytes.  We then went back to the tibia and off with size B tibial tray, we made our keel cut for this as well.  We then irrigated the knee with normal saline solution and removed all soft tissue debris we could.  We then mixed our cement and cemented the real Biomet Oxford tibial tray size B for right knee followed by the real small femur.  We then removed all cement debris and let our knee compress in 45-degree fixed angle with a 6-mm polyethylene insert before that.  We then removed this once the cement had hardened and placed the real fix- bearing 6-mm polyethylene insert and we were pleased with range of motion and stability and how the implant appeared to her line.  We then irrigated the knee again with normal saline solution using pulsatile lavage and closed the joint capsule with interrupted #1 Ethibond suture followed by 0 Vicryl in the deep tissue, 2-0 Vicryl in the subcutaneous tissue, 4-0 Monocryl subcuticular stitch and Steri-Strips on the skin. Well-padded sterile dressing was applied.  We then took our drapes down and re-prepped the foot and changed out our gown and gloves and sterile drapes.  We made a second time-out for identifying as the  correct patient and correct right great toe.  I then made an incision directly over the exostosis and removed soft tissue around the great toe and then tightened up the medial tissue to allow the pull the toe in a better alignment, this was not a true bunion correction though and she understands we were not proceeding with that.  We then irrigated that soft tissue and closed the deep tissue.  We realigned the toe with 0 Vicryl followed by interrupted 3-0 nylon skin.  Xeroform and well-padded sterile dressing were placed on that as well.  The tourniquet was let down.  Her toes were pink and nicely.  She was taken to the recovery room in stable condition.  All final  counts were correct.  There were no complications noted.  Of note, Richardean CanalGilbert Clark, PA-C assisted in the entire case.  His assistance was crucial for facilitating all aspects of this case.     Vanita Pandahristopher Y. Magnus IvanBlackman, M.D.    CYB/MEDQ  D:  03/31/2015  T:  03/31/2015  Job:  [id number]

## 2015-03-31 NOTE — Anesthesia Postprocedure Evaluation (Signed)
Anesthesia Post Note  Patient: Leotis PainDebra L Rollings  Procedure(s) Performed: Procedure(s) (LRB): RIGHT UNICOMPARTMENTAL KNEE ARTHROPLASTY (Right) EXOSTOSIS EXCISION RIGHT FOOT (Right)  Patient location during evaluation: PACU Anesthesia Type: MAC and Spinal Level of consciousness: awake and alert and oriented Pain management: pain level controlled Vital Signs Assessment: post-procedure vital signs reviewed and stable Respiratory status: spontaneous breathing, nonlabored ventilation and respiratory function stable Cardiovascular status: stable Postop Assessment: spinal receding Anesthetic complications: no    Last Vitals:  Filed Vitals:   03/31/15 1130 03/31/15 1145  BP: 125/69 117/70  Pulse: 62 65  Temp:    Resp: 13 15    Last Pain:  Filed Vitals:   03/31/15 1148  PainSc: 5                  Shirleyann Montero S

## 2015-04-01 DIAGNOSIS — M1711 Unilateral primary osteoarthritis, right knee: Secondary | ICD-10-CM | POA: Diagnosis not present

## 2015-04-01 NOTE — Progress Notes (Signed)
Subjective:2 1 Day Post-Op Procedure(s) (LRB): RIGHT UNICOMPARTMENTAL KNEE ARTHROPLASTY (Right) EXOSTOSIS EXCISION RIGHT FOOT (Right) Patient reports pain as moderate.    Objective: Vital signs in last 24 hours: Temp:  [97.4 F (36.3 C)-98.7 F (37.1 C)] 98 F (36.7 C) (12/17 0910) Pulse Rate:  [56-87] 69 (12/17 0910) Resp:  [9-20] 20 (12/17 0910) BP: (102-125)/(50-72) 114/50 mmHg (12/17 0910) SpO2:  [97 %-100 %] 100 % (12/17 0910)  Intake/Output from previous day: 12/16 0701 - 12/17 0700 In: 3332.5 [P.O.:720; I.V.:2612.5] Out: 2925 [Urine:2875; Blood:50] Intake/Output this shift:    No results for input(s): HGB in the last 72 hours. No results for input(s): WBC, RBC, HCT, PLT in the last 72 hours. No results for input(s): NA, K, CL, CO2, BUN, CREATININE, GLUCOSE, CALCIUM in the last 72 hours. No results for input(s): LABPT, INR in the last 72 hours.  Neurologically intact  Assessment/Plan: 1 Day Post-Op Procedure(s) (LRB): RIGHT UNICOMPARTMENTAL KNEE ARTHROPLASTY (Right) EXOSTOSIS EXCISION RIGHT FOOT (Right) Up with therapy  Home today after PT  Sophia Adams C 04/01/2015, 9:39 AM

## 2015-04-01 NOTE — Care Management Note (Signed)
Case Management Note  Patient Details  Name: Sophia Adams MRN: [id number] Date of Birth: 02/20/1956  Subjective/Objective:                  right partial knee arthroplasty.  Action/Plan: CM spoke with patient at the bedside. Patient states she has a RW at home. She would like [facility] for HHPT. CM spoke with Irving BurtonEmily at [facility] Referral faxed to [facility] Intake. Irving Burtonmily states they will contact CM if they do not accept the patients BCBS plan.   Expected Discharge Date:     04/02/15             Expected Discharge Plan:  Home w Home Health Services  In-House Referral:     Discharge planning Services  CM Consult  Post Acute Care Choice:   Home Health Choice offered to:  Patient  DME Arranged:  N/A DME Agency:  NA  HH Arranged:  PT HH Agency:  [facility]  Status of Service:  In process, will continue to follow  Medicare Important Message Given:    Date Medicare IM Given:    Medicare IM give by:    Date Additional Medicare IM Given:    Additional Medicare Important Message give by:     If discussed at Long Length of Stay Meetings, dates discussed:    Additional Comments:  Antony HasteBennett, Turquoise Esch Harris, RN 04/01/2015, 1:30 PM

## 2015-04-01 NOTE — Progress Notes (Signed)
OT Cancellation Note  Patient Details Name: Sophia MassyDebra L Adams MRN: [id number] DOB: 06/11/1955   Cancelled Treatment:    Reason Eval/Treat Not Completed: OT screened, no needs identified, will sign off  Angelene GiovanniConarpe, Demetrie Borge M  [facility] [place], OTR/L [phone]  04/01/2015, 1:00 PM

## 2015-04-01 NOTE — Progress Notes (Signed)
Physical Therapy Treatment Patient Details Name: Sophia MassyDebra L Sangiovanni MRN: [id number] DOB: 07/27/1955 Today's Date: 04/01/2015    History of Present Illness Pt is a 59 year old female s/p R unicompartmental knee arthroplasty and exostosis excision R foot    PT Comments    Pt tolerated well, and dtr present during session on how to assist pt with exercises and steps.    Follow Up Recommendations  Home health PT     Equipment Recommendations  None recommended by PT    Recommendations for Other Services       Precautions / Restrictions Precautions Precautions: Knee Restrictions Weight Bearing Restrictions: No Other Position/Activity Restrictions: WBAT    Mobility  Bed Mobility Overal bed mobility: Needs Assistance Bed Mobility: Supine to Sit;Sit to Supine     Supine to sit: Min assist Sit to supine: Min guard   General bed mobility comments: verbal cues for technique, assist for R LE over EOB  Transfers Overall transfer level: Needs assistance Equipment used: Rolling walker (2 wheeled)   Sit to Stand: Min guard         General transfer comment: verbal cues for UE and LE positioning  Ambulation/Gait Ambulation/Gait assistance: Min guard Ambulation Distance (Feet): 30 Feet (focused on stair training instead of walign distance due to DC needs ) Assistive device: Rolling walker (2 wheeled) Gait Pattern/deviations: Step-to pattern     General Gait Details: verbal cues for sequence, RW positioning, step length   Stairs Stairs: Yes Stairs assistance: Min guard Stair Management: One rail Right Number of Stairs: 4 General stair comments: also perfromed the 1 step up and down forward. the 4 steps performed with no AD and  side stepping facing the rail each direction , handout given   Wheelchair Mobility    Modified Rankin (Stroke Patients Only)       Balance                                    Cognition Arousal/Alertness: Awake/alert Behavior  During Therapy: WFL for tasks assessed/performed Overall Cognitive Status: Within Functional Limits for tasks assessed                      Exercises Total Joint Exercises Ankle Circles/Pumps: AROM;10 reps;Both Quad Sets: AROM;Right;5 reps;Supine Heel Slides: AAROM;Supine;Right;5 reps Hip ABduction/ADduction: AAROM;Supine;Right;5 reps Straight Leg Raises: AAROM;Supine;Right;5 reps Goniometric ROM: 0-60 (educated on how to perform  dangling eob and strethcing for flexion . handout given for exercises)    General Comments        Pertinent Vitals/Pain Pain Assessment: 0-10 Pain Score: 3  Pain Location: r knee  Pain Descriptors / Indicators: Aching Pain Intervention(s): Monitored during session;Premedicated before session;Ice applied    Home Living                      Prior Function            PT Goals (current goals can now be found in the care plan section) Acute Rehab PT Goals PT Goal Formulation: With patient Time For Goal Achievement: 04/05/15 Potential to Achieve Goals: Good    Frequency  7X/week    PT Plan      Co-evaluation             End of Session Equipment Utilized During Treatment: Gait belt Activity Tolerance: Patient tolerated treatment well Patient left: in bed;with call bell/phone within  reach;with family/visitor present     Time: 4098-1191 PT Time Calculation (min) (ACUTE ONLY): 42 min  Charges:  $Gait Training: 8-22 mins $Therapeutic Exercise: 8-22 mins $Therapeutic Activity: 8-22 mins                    G CodesMarella Bile April 14, 2015, 12:32 PM  Marella Bile, PT Pager: [phone] 04/14/15

## 2015-04-01 NOTE — Progress Notes (Signed)
D/C instructions reviewed w/ pt and dtr, both verbalize understanding and all questions answered. Pt d/c in stable condition in w/c to dtr's car by NT. Pt in possession of d/c instructions, scripts, and all personal belongings.

## 2015-04-01 NOTE — Discharge Instructions (Signed)
Information on my medicine - XARELTO® (Rivaroxaban) ° °This medication education was reviewed with me or my healthcare representative as part of my discharge preparation.  The pharmacist that spoke with me during my hospital stay was:  Pickering, Thomas Robert, RPH ° °Why was Xarelto® prescribed for you? °Xarelto® was prescribed for you to reduce the risk of blood clots forming after orthopedic surgery. The medical term for these abnormal blood clots is venous thromboembolism (VTE). ° °What do you need to know about xarelto® ? °Take your Xarelto® ONCE DAILY at the same time every day. °You may take it either with or without food. ° °If you have difficulty swallowing the tablet whole, you may crush it and mix in applesauce just prior to taking your dose. ° °Take Xarelto® exactly as prescribed by your doctor and DO NOT stop taking Xarelto® without talking to the doctor who prescribed the medication.  Stopping without other VTE prevention medication to take the place of Xarelto® may increase your risk of developing a clot. ° °After discharge, you should have regular check-up appointments with your healthcare provider that is prescribing your Xarelto®.   ° °What do you do if you miss a dose? °If you miss a dose, take it as soon as you remember on the same day then continue your regularly scheduled once daily regimen the next day. Do not take two doses of Xarelto® on the same day.  ° °Important Safety Information °A possible side effect of Xarelto® is bleeding. You should call your healthcare provider right away if you experience any of the following: °? Bleeding from an injury or your nose that does not stop. °? Unusual colored urine (red or dark brown) or unusual colored stools (red or black). °? Unusual bruising for unknown reasons. °? A serious fall or if you hit your head (even if there is no bleeding). ° °Some medicines may interact with Xarelto® and might increase your risk of bleeding while on Xarelto®. To help  avoid this, consult your healthcare provider or pharmacist prior to using any new prescription or non-prescription medications, including herbals, vitamins, non-steroidal anti-inflammatory drugs (NSAIDs) and supplements. ° °This website has more information on Xarelto®: www.xarelto.com. ° ° °INSTRUCTIONS AFTER JOINT REPLACEMENT  ° °o Remove items at home which could result in a fall. This includes throw rugs or furniture in walking pathways °o ICE to the affected joint every three hours while awake for 30 minutes at a time, for at least the first 3-5 days, and then as needed for pain and swelling.  Continue to use ice for pain and swelling. You may notice swelling that will progress down to the foot and ankle.  This is normal after surgery.  Elevate your leg when you are not up walking on it.   °o Continue to use the breathing machine you got in the hospital (incentive spirometer) which will help keep your temperature down.  It is common for your temperature to cycle up and down following surgery, especially at night when you are not up moving around and exerting yourself.  The breathing machine keeps your lungs expanded and your temperature down. ° ° °DIET:  As you were doing prior to hospitalization, we recommend a well-balanced diet. ° °DRESSING / WOUND CARE / SHOWERING ° °Keep the surgical dressing until follow up.  The dressing is water proof, so you can shower without any extra covering.  IF THE DRESSING FALLS OFF or the wound gets wet inside, change the dressing with sterile gauze.    Please use good hand washing techniques before changing the dressing.  Do not use any lotions or creams on the incision until instructed by your surgeon.   ° °ACTIVITY ° °o Increase activity slowly as tolerated, but follow the weight bearing instructions below.   °o No driving for 6 weeks or until further direction given by your physician.  You cannot drive while taking narcotics.  °o No lifting or carrying greater than 10 lbs.  until further directed by your surgeon. °o Avoid periods of inactivity such as sitting longer than an hour when not asleep. This helps prevent blood clots.  °o You may return to work once you are authorized by your doctor.  ° ° ° °WEIGHT BEARING  ° °Weight bearing as tolerated with assist device (walker, cane, etc) as directed, use it as long as suggested by your surgeon or therapist, typically at least 4-6 weeks. ° ° °EXERCISES ° °Results after joint replacement surgery are often greatly improved when you follow the exercise, range of motion and muscle strengthening exercises prescribed by your doctor. Safety measures are also important to protect the joint from further injury. Any time any of these exercises cause you to have increased pain or swelling, decrease what you are doing until you are comfortable again and then slowly increase them. If you have problems or questions, call your caregiver or physical therapist for advice.  ° °Rehabilitation is important following a joint replacement. After just a few days of immobilization, the muscles of the leg can become weakened and shrink (atrophy).  These exercises are designed to build up the tone and strength of the thigh and leg muscles and to improve motion. Often times heat used for twenty to thirty minutes before working out will loosen up your tissues and help with improving the range of motion but do not use heat for the first two weeks following surgery (sometimes heat can increase post-operative swelling).  ° °These exercises can be done on a training (exercise) mat, on the floor, on a table or on a bed. Use whatever works the best and is most comfortable for you.    Use music or television while you are exercising so that the exercises are a pleasant break in your day. This will make your life better with the exercises acting as a break in your routine that you can look forward to.   Perform all exercises about fifteen times, three times per day or as  directed.  You should exercise both the operative leg and the other leg as well. ° °Exercises include: °  °• Quad Sets - Tighten up the muscle on the front of the thigh (Quad) and hold for 5-10 seconds.   °• Straight Leg Raises - With your knee straight (if you were given a brace, keep it on), lift the leg to 60 degrees, hold for 3 seconds, and slowly lower the leg.  Perform this exercise against resistance later as your leg gets stronger.  °• Leg Slides: Lying on your back, slowly slide your foot toward your buttocks, bending your knee up off the floor (only go as far as is comfortable). Then slowly slide your foot back down until your leg is flat on the floor again.  °• Angel Wings: Lying on your back spread your legs to the side as far apart as you can without causing discomfort.  °• Hamstring Strength:  Lying on your back, push your heel against the floor with your leg straight by tightening up the   muscles of your buttocks.  Repeat, but this time bend your knee to a comfortable angle, and push your heel against the floor.  You may put a pillow under the heel to make it more comfortable if necessary.  ° °A rehabilitation program following joint replacement surgery can speed recovery and prevent re-injury in the future due to weakened muscles. Contact your doctor or a physical therapist for more information on knee rehabilitation.  ° ° °CONSTIPATION ° °Constipation is defined medically as fewer than three stools per week and severe constipation as less than one stool per week.  Even if you have a regular bowel pattern at home, your normal regimen is likely to be disrupted due to multiple reasons following surgery.  Combination of anesthesia, postoperative narcotics, change in appetite and fluid intake all can affect your bowels.  ° °YOU MUST use at least one of the following options; they are listed in order of increasing strength to get the job done.  They are all available over the counter, and you may need to  use some, POSSIBLY even all of these options:   ° °Drink plenty of fluids (prune juice may be helpful) and high fiber foods °Colace 100 mg by mouth twice a day  °Senokot for constipation as directed and as needed Dulcolax (bisacodyl), take with full glass of water  °Miralax (polyethylene glycol) once or twice a day as needed. ° °If you have tried all these things and are unable to have a bowel movement in the first 3-4 days after surgery call either your surgeon or your primary doctor.   ° °If you experience loose stools or diarrhea, hold the medications until you stool forms back up.  If your symptoms do not get better within 1 week or if they get worse, check with your doctor.  If you experience "the worst abdominal pain ever" or develop nausea or vomiting, please contact the office immediately for further recommendations for treatment. ° ° °ITCHING:  If you experience itching with your medications, try taking only a single pain pill, or even half a pain pill at a time.  You can also use Benadryl over the counter for itching or also to help with sleep.  ° °TED HOSE STOCKINGS:  Use stockings on both legs until for at least 2 weeks or as directed by physician office. They may be removed at night for sleeping. ° °MEDICATIONS:  See your medication summary on the “After Visit Summary” that nursing will review with you.  You may have some home medications which will be placed on hold until you complete the course of blood thinner medication.  It is important for you to complete the blood thinner medication as prescribed. ° °PRECAUTIONS:  If you experience chest pain or shortness of breath - call 911 immediately for transfer to the hospital emergency department.  ° °If you develop a fever greater that 101 F, purulent drainage from wound, increased redness or drainage from wound, foul odor from the wound/dressing, or calf pain - CONTACT YOUR SURGEON.   °                                                °FOLLOW-UP  APPOINTMENTS:  If you do not already have a post-op appointment, please call the office for an appointment to be seen by your surgeon.  Guidelines for how   soon to be seen are listed in your “After Visit Summary”, but are typically between 1-4 weeks after surgery. ° °OTHER INSTRUCTIONS:  ° °Knee Replacement:  Do not place pillow under knee, focus on keeping the knee straight while resting. CPM instructions: 0-90 degrees, 2 hours in the morning, 2 hours in the afternoon, and 2 hours in the evening. Place foam block, curve side up under heel at all times except when in CPM or when walking.  DO NOT modify, tear, cut, or change the foam block in any way. ° °MAKE SURE YOU:  °• Understand these instructions.  °• Get help right away if you are not doing well or get worse.  ° ° °Thank you for letting us be a part of your medical care team.  It is a privilege we respect greatly.  We hope these instructions will help you stay on track for a fast and full recovery!  ° °

## 2015-04-01 NOTE — Discharge Summary (Signed)
Patient ID: Sophia MassyDebra L Espy MRN: [id number] DOB/AGE: 57/11/1955 57 y.o.  Admit date: 03/31/2015 Discharge date: 04/01/2015  Admission Diagnoses:  Principal Problem:   Osteoarthritis of right knee medial compartment Active Problems:   Status post right partial knee replacement   Discharge Diagnoses:  Same  Past Medical History  Diagnosis Date  . Hypothyroidism   . Arthritis     oa  . Anemia 21 yrs ago  . Family history of adverse reaction to anesthesia     both parents get ponv    Surgeries: Procedure(s): RIGHT UNICOMPARTMENTAL KNEE ARTHROPLASTY EXOSTOSIS EXCISION RIGHT FOOT on 03/31/2015   Consultants:    Discharged Condition: Improved  Hospital Course: Sophia MassyDebra L Adams is an 59 y.o. female who was admitted 03/31/2015 for operative treatment ofOsteoarthritis of right knee. Patient has severe unremitting pain that affects sleep, daily activities, and work/hobbies. After pre-op clearance the patient was taken to the operating room on 03/31/2015 and underwent  Procedure(s): RIGHT UNICOMPARTMENTAL KNEE ARTHROPLASTY EXOSTOSIS EXCISION RIGHT FOOT.    Patient was given perioperative antibiotics: Anti-infectives    Start     Dose/Rate Route Frequency Ordered Stop   03/31/15 1400  ceFAZolin (ANCEF) IVPB 1 g/50 mL premix     1 g 100 mL/hr over 30 Minutes Intravenous Every 6 hours 03/31/15 1220 03/31/15 2114   03/31/15 0605  ceFAZolin (ANCEF) IVPB 2 g/50 mL premix     2 g 100 mL/hr over 30 Minutes Intravenous On call to O.R. 03/31/15 [id number] 03/31/15 0740       Patient was given sequential compression devices, early ambulation, and chemoprophylaxis to prevent DVT.  Patient benefited maximally from hospital stay and there were no complications.    Recent vital signs: Patient Vitals for the past 24 hrs:  BP Temp Temp src Pulse Resp SpO2  04/01/15 0910 (!) 114/50 mmHg 98 F (36.7 C) Oral 69 20 100 %  04/01/15 0506 110/61 mmHg 97.7 F (36.5 C) Oral 87 16 98 %  04/01/15 0140 (!)  111/50 mmHg 98.2 F (36.8 C) Oral 66 16 98 %  03/31/15 2116 (!) 113/56 mmHg 98.2 F (36.8 C) Oral 72 16 97 %  03/31/15 1829 (!) 121/57 mmHg 98 F (36.7 C) Oral 85 18 99 %  03/31/15 1505 113/66 mmHg 98.7 F (37.1 C) Oral 72 18 99 %  03/31/15 1323 118/69 mmHg 98.5 F (36.9 C) Oral 63 16 100 %  03/31/15 1203 117/64 mmHg 98 F (36.7 C) - - 15 100 %  03/31/15 1145 117/70 mmHg 97.4 F (36.3 C) - 65 15 100 %     Recent laboratory studies: No results for input(s): WBC, HGB, HCT, PLT, NA, K, CL, CO2, BUN, CREATININE, GLUCOSE, INR, CALCIUM in the last 72 hours.  Invalid input(s): PT, 2   Discharge Medications:     Medication List    STOP taking these medications        ibuprofen 200 MG tablet  Commonly known as:  ADVIL,MOTRIN      TAKE these medications        methocarbamol 500 MG tablet  Commonly known as:  ROBAXIN  Take 1 tablet (500 mg total) by mouth every 6 (six) hours as needed for muscle spasms.     oxyCODONE-acetaminophen 5-325 MG tablet  Commonly known as:  ROXICET  Take 1-2 tablets by mouth every 4 (four) hours as needed for severe pain.     rivaroxaban 10 MG Tabs tablet  Commonly known as:  XARELTO  Take 1 tablet (  10 mg total) by mouth daily with breakfast.     SYNTHROID 88 MCG tablet  Generic drug:  levothyroxine  Take 88 mcg by mouth daily before breakfast.        Diagnostic Studies: Dg Knee Right Port  03/31/2015  CLINICAL DATA:  Status post right partial knee replacement EXAM: PORTABLE RIGHT KNEE - 1-2 VIEW COMPARISON:  None. FINDINGS: Right medial femorotibial compartment hemiarthroplasty without failure or complication. No acute fracture dislocation. Small joint effusion and air within the suprapatellar joint space. Mild osteoarthritis of the lateral femorotibial compartment. Moderate -severe osteoarthritis of the patellofemoral compartment. IMPRESSION: Status post right medial femorotibial compartment hemiarthroplasty. Electronically Signed   By: Elige Ko   On: 03/31/2015 10:29    Disposition:  To home      Discharge Instructions    Discharge patient    Complete by:  As directed            Follow-up Information    Follow up with Kathryne Hitch, MD. Schedule an appointment as soon as possible for a visit in 2 weeks.   Specialty:  Orthopedic Surgery   Contact information:   [address] [place] [address] [phone]        Signed: Kathryne Hitch 04/01/2015, 11:33 AM

## 2015-12-05 ENCOUNTER — Other Ambulatory Visit: Payer: Self-pay | Admitting: Family Medicine

## 2015-12-05 DIAGNOSIS — Z1231 Encounter for screening mammogram for malignant neoplasm of breast: Secondary | ICD-10-CM

## 2016-01-26 ENCOUNTER — Ambulatory Visit
Admission: RE | Admit: 2016-01-26 | Discharge: 2016-01-26 | Disposition: A | Discharge status: Home / Self Care | Payer: BC Managed Care – PPO | Source: Ambulatory Visit | Attending: Family Medicine | Admitting: Family Medicine

## 2016-01-26 DIAGNOSIS — Z1231 Encounter for screening mammogram for malignant neoplasm of breast: Secondary | ICD-10-CM

## 2017-02-05 ENCOUNTER — Other Ambulatory Visit (HOSPITAL_COMMUNITY): Payer: Self-pay | Admitting: Physical Therapy

## 2017-02-05 ENCOUNTER — Other Ambulatory Visit: Payer: Self-pay | Admitting: Family Medicine

## 2017-02-05 DIAGNOSIS — Z1231 Encounter for screening mammogram for malignant neoplasm of breast: Secondary | ICD-10-CM

## 2017-03-03 ENCOUNTER — Encounter (INDEPENDENT_AMBULATORY_CARE_PROVIDER_SITE_OTHER): Payer: Self-pay

## 2017-03-03 ENCOUNTER — Ambulatory Visit
Admission: RE | Admit: 2017-03-03 | Discharge: 2017-03-03 | Disposition: A | Discharge status: Home / Self Care | Payer: BC Managed Care – PPO | Source: Ambulatory Visit | Attending: Family Medicine | Admitting: Family Medicine

## 2017-03-03 DIAGNOSIS — Z1231 Encounter for screening mammogram for malignant neoplasm of breast: Secondary | ICD-10-CM

## 2018-03-17 ENCOUNTER — Other Ambulatory Visit: Payer: Self-pay | Admitting: Family Medicine

## 2018-03-17 DIAGNOSIS — Z1231 Encounter for screening mammogram for malignant neoplasm of breast: Secondary | ICD-10-CM

## 2018-03-26 ENCOUNTER — Ambulatory Visit
Admission: RE | Admit: 2018-03-26 | Discharge: 2018-03-26 | Disposition: A | Discharge status: Home / Self Care | Payer: BC Managed Care – PPO | Source: Ambulatory Visit | Attending: Family Medicine | Admitting: Family Medicine

## 2018-03-26 DIAGNOSIS — Z1231 Encounter for screening mammogram for malignant neoplasm of breast: Secondary | ICD-10-CM

## 2018-11-24 ENCOUNTER — Other Ambulatory Visit: Payer: Self-pay | Admitting: Otolaryngology

## 2018-11-24 DIAGNOSIS — H8122 Vestibular neuronitis, left ear: Secondary | ICD-10-CM

## 2019-03-17 ENCOUNTER — Other Ambulatory Visit: Payer: Self-pay | Admitting: Family Medicine

## 2019-03-17 DIAGNOSIS — Z1231 Encounter for screening mammogram for malignant neoplasm of breast: Secondary | ICD-10-CM

## 2019-05-12 ENCOUNTER — Ambulatory Visit
Admission: RE | Admit: 2019-05-12 | Discharge: 2019-05-12 | Disposition: A | Discharge status: Home / Self Care | Payer: BC Managed Care – PPO | Source: Ambulatory Visit | Attending: Family Medicine | Admitting: Family Medicine

## 2019-05-12 ENCOUNTER — Other Ambulatory Visit: Payer: Self-pay

## 2019-05-12 DIAGNOSIS — Z1231 Encounter for screening mammogram for malignant neoplasm of breast: Secondary | ICD-10-CM

## 2020-08-01 ENCOUNTER — Other Ambulatory Visit: Payer: Self-pay | Admitting: Family Medicine

## 2020-08-01 DIAGNOSIS — Z1231 Encounter for screening mammogram for malignant neoplasm of breast: Secondary | ICD-10-CM

## 2020-10-09 ENCOUNTER — Other Ambulatory Visit: Payer: Self-pay

## 2020-10-09 ENCOUNTER — Ambulatory Visit
Admission: RE | Admit: 2020-10-09 | Discharge: 2020-10-09 | Disposition: A | Discharge status: Home / Self Care | Payer: BC Managed Care – PPO | Source: Ambulatory Visit | Attending: Family Medicine | Admitting: Family Medicine

## 2020-10-09 DIAGNOSIS — Z1231 Encounter for screening mammogram for malignant neoplasm of breast: Secondary | ICD-10-CM

## 2021-01-11 ENCOUNTER — Ambulatory Visit: Payer: Self-pay

## 2021-01-11 ENCOUNTER — Ambulatory Visit: Payer: BC Managed Care – PPO | Admitting: Orthopaedic Surgery

## 2021-01-11 VITALS — Ht 64.5 in | Wt 200.0 lb

## 2021-01-11 DIAGNOSIS — M25562 Pain in left knee: Secondary | ICD-10-CM

## 2021-01-11 DIAGNOSIS — G8929 Other chronic pain: Secondary | ICD-10-CM

## 2021-01-11 DIAGNOSIS — M1712 Unilateral primary osteoarthritis, left knee: Secondary | ICD-10-CM

## 2021-01-11 MED ORDER — METHYLPREDNISOLONE ACETATE 40 MG/ML IJ SUSP
40.0000 mg | INTRAMUSCULAR | Status: AC | PRN
  Administered 2021-01-11: 40 mg via INTRA_ARTICULAR

## 2021-01-11 MED ORDER — LIDOCAINE HCL 1 % IJ SOLN
3.0000 mL | INTRAMUSCULAR | Status: AC | PRN
  Administered 2021-01-11: 3 mL

## 2021-01-11 NOTE — Progress Notes (Signed)
Office Visit Note   Patient: Sophia Adams           Date of Birth: 08-08-1955           MRN: [id number] Visit Date: 01/11/2021              Requested by: Ailene Ravel, MD [address] [place],  [place] [address] PCP: Ailene Ravel, MD   Assessment & Plan: Visit Diagnoses:  1. Chronic pain of left knee   2. Unilateral primary osteoarthritis, left knee     Plan: We talked in length in detail about her left knee.  She does have tricompartment arthritis with that knee and I would recommend a total knee arthroplasty as opposed to partial knee replacement.  We did decide to stay conservative for now.  We did place a steroid injection in her left knee today which she tolerated well.  She will let us know how she does over the next month or so we can always consider hyaluronic acid as another step since she is only 65 years old.  The long-term solution would be a knee replacement.  Follow-Up Instructions: Return if symptoms worsen or fail to improve.   Orders:  Orders Placed This Encounter  Procedures   Large Joint Inj: L knee   XR Knee 1-2 Views Left   No orders of the defined types were placed in this encounter.     Procedures: Large Joint Inj: L knee on 01/11/2021 3:52 PM Indications: diagnostic evaluation and pain Details: 22 G 1.5 in needle, superolateral approach  Arthrogram: No  Medications: 3 mL lidocaine 1 %; 40 mg methylPREDNISolone acetate 40 MG/ML Outcome: tolerated well, no immediate complications Procedure, treatment alternatives, risks and benefits explained, specific risks discussed. Consent was given by the patient. Immediately prior to procedure a time out was called to verify the correct patient, procedure, equipment, support staff and site/side marked as required. Patient was prepped and draped in the usual sterile fashion.      Clinical Data: No additional findings.   Subjective: Chief Complaint  Patient presents with   Left Knee - Pain   The patient is well-known to me.  We actually performed a unicompartmental knee replacement on the right knee in 2016.  Her left knee has been bothering her for some time now.  It has been getting worse with her actives daily living and it locks up on her.  It does wake her up at night as well.  She takes ibuprofen for that pain.  She for has never had surgery on her left knee.  She states her right partial knee replacement is still doing well.  She has had no acute change in her medical status.  HPI  Review of Systems   Objective: Vital Signs: Ht 5' 4.5" (1.638 m)   Wt 200 lb (90.7 kg)   BMI 33.80 kg/m   Physical Exam She is alert and orient x3 and in no acute distress Ortho Exam Examination of her left knee does show some varus malalignment that is correctable.  There is some laxity in the ligaments of the.  The range of motion is full.  There is significant patellofemoral gravitation and medial joint line tenderness. Specialty Comments:  No specialty comments available.  Imaging: XR Knee 1-2 Views Left  Result Date: 01/11/2021 2 views of the left knee tricompartment arthritic changes with significant medial and patellofemoral joint space narrowing.  There are large osteophytes in all 3 compartments.  There is varus malalignment.    PMFS History: Patient Active Problem List   Diagnosis Date Noted   Unilateral primary osteoarthritis, left knee 01/11/2021   Osteoarthritis of right knee medial compartment 03/31/2015   Status post right partial knee replacement 03/31/2015   Past Medical History:  Diagnosis Date   Anemia 21 yrs ago   Arthritis    oa   Family history of adverse reaction to anesthesia    both parents get ponv   Hypothyroidism     Family History  Problem Relation Age of Onset   Breast cancer Neg Hx     Past Surgical History:  Procedure Laterality Date   BONE EXOSTOSIS EXCISION Right 03/31/2015   Procedure: EXOSTOSIS EXCISION RIGHT FOOT;  Surgeon:  Kathryne Hitch, MD;  Location: [facility];  Service: Orthopedics;  Laterality: Right;   BREAST BIOPSY Left 1990 and 1986   benign   BREAST EXCISIONAL BIOPSY     PARTIAL KNEE ARTHROPLASTY Right 03/31/2015   Procedure: RIGHT UNICOMPARTMENTAL KNEE ARTHROPLASTY;  Surgeon: Kathryne Hitch, MD;  Location: [facility];  Service: Orthopedics;  Laterality: Right;   right knee arthroscopy  dec 2015   TONSILLECTOMY  1965   TUBAL LIGATION  1995   Social History   Occupational History   Not on file  Tobacco Use   Smoking status: Never   Smokeless tobacco: Never  Substance and Sexual Activity   Alcohol use: No   Drug use: No   Sexual activity: Not on file

## 2021-02-13 ENCOUNTER — Other Ambulatory Visit: Payer: Self-pay

## 2021-02-19 ENCOUNTER — Telehealth: Payer: Self-pay | Admitting: Orthopaedic Surgery

## 2021-02-19 NOTE — Telephone Encounter (Signed)
Pt submitted medical release form, FMLA forms, and $25.00 check. Accepted 02/19/21

## 2021-03-01 ENCOUNTER — Other Ambulatory Visit: Payer: Self-pay | Admitting: Physician Assistant

## 2021-03-01 NOTE — Progress Notes (Signed)
Sent message, via epic in basket, requesting orders in epic from surgeon.  

## 2021-03-06 ENCOUNTER — Encounter (HOSPITAL_COMMUNITY): Payer: Self-pay

## 2021-03-06 NOTE — Patient Instructions (Addendum)
DUE TO COVID-19 ONLY ONE VISITOR IS ALLOWED TO COME WITH YOU AND STAY IN THE WAITING ROOM ONLY DURING PRE OP AND PROCEDURE.   **NO VISITORS ARE ALLOWED IN THE SHORT STAY AREA OR RECOVERY ROOM!!**  IF YOU WILL BE ADMITTED INTO THE HOSPITAL YOU ARE ALLOWED ONLY TWO SUPPORT PEOPLE DURING VISITATION HOURS ONLY (7 AM -8PM)    Up to two visitors ages 2+ are allowed at one time in a patient's room.  The visitors may rotate out with other people throughout the day.  Additionally, up to two children between the ages of 29 and 37 are allowed and do not count toward the number of allowed visitors.  Children within this age range must be accompanied by an adult visitor.  One adult visitor may remain with the patient overnight and must be in the room by 8 PM.  COVID SWAB TESTING MUST BE COMPLETED ON:  Wednesday, 03-14-21, Between the hours of 8 and 3  **MUST PRESENT COMPLETED FORM AT TESTING SITE**    [address] [place] (backside of the building)  You are not required to quarantine, however you are required to wear a well-fitted mask when you are out and around people not in your household.  Hand Hygiene often Do NOT share personal items Notify your provider if you are in close contact with someone who has COVID or you develop fever 100.4 or greater, new onset of sneezing, cough, sore throat, shortness of breath or body aches.        Your procedure is scheduled on:  Friday, 03-16-21   Report to [facility] Main  Entrance     Report to admitting at 5:15 AM   Call this number if you have problems the morning of surgery [phone]   Do not eat food :After Midnight.   May have liquids until 4:15 AM day of surgery  CLEAR LIQUID DIET  Foods Allowed                                                                     Foods Excluded  Water, Black Coffee (no milk/no creamer) and tea, regular and decaf                              liquids that you cannot  Plain Jell-O in any  flavor  (No red)                         see through such as: Fruit ices (not with fruit pulp)                                 milk, soups, orange juice  Iced Popsicles (No red)                                    All solid food                             Apple juices  Sports drinks like Gatorade (No red) Lightly seasoned clear broth or consume(fat free) Sugar     Complete one G2 drink the morning of surgery at 4:15 AM the day of surgery.      The day of surgery:  Drink ONE (1) Pre-Surgery Clear G2 the morning of surgery. Drink in one sitting. Do not sip.  This drink was given to you during your hospital  pre-op appointment visit. Nothing else to drink after completing the Pre-Surgery G2 .          If you have questions, please contact your surgeon's office.     Oral Hygiene is also important to reduce your risk of infection.                                    Remember - BRUSH YOUR TEETH THE MORNING OF SURGERY WITH YOUR REGULAR TOOTHPASTE   Do NOT smoke after Midnight   Take these medicines the morning of surgery with A SIP OF WATER:  Synthroid   Stop all vitamins and herbal supplements a week before surgery             You may not have any metal on your body including hair pins, jewelry, and body piercing             Do not wear make-up, lotions, powders, perfumes or deodorant  Do not wear nail polish including gel and S&S, artificial/acrylic nails, or any other type of covering on natural nails including finger and toenails. If you have artificial nails, gel coating, etc. that needs to be removed by a nail salon please have this removed prior to surgery or surgery may need to be canceled/ delayed if the surgeon/ anesthesia feels like they are unable to be safely monitored.   Do not shave  48 hours prior to surgery.   Do not bring valuables to the hospital. [facility] IS NOT RESPONSIBLE FOR VALUABLES.   Contacts, dentures or bridgework may not be worn into surgery.   Bring  small overnight bag day of surgery.    Special Instructions: Bring a copy of your healthcare power of attorney and living will documents the day of surgery if you haven't scanned them in before.  Please read over the following fact sheets you were given: IF YOU HAVE QUESTIONS ABOUT YOUR PRE OP INSTRUCTIONS PLEASE CALL [phone] [facility] - Preparing for Surgery Before surgery, you can play an important role.  Because skin is not sterile, your skin needs to be as free of germs as possible.  You can reduce the number of germs on your skin by washing with CHG (chlorahexidine gluconate) soap before surgery.  CHG is an antiseptic cleaner which kills germs and bonds with the skin to continue killing germs even after washing. Please DO NOT use if you have an allergy to CHG or antibacterial soaps.  If your skin becomes reddened/irritated stop using the CHG and inform your nurse when you arrive at Short Stay. Do not shave (including legs and underarms) for at least 48 hours prior to the first CHG shower.  You may shave your face/neck.  Please follow these instructions carefully:  1.  Shower with CHG Soap the night before surgery and the  morning of surgery.  2.  If you choose to wash your hair, wash your hair first as usual with your normal  shampoo.  3.  After you shampoo, rinse your hair and body thoroughly to remove the shampoo.                             4.  Use CHG as you would any other liquid soap.  You can apply chg directly to the skin and wash.  Gently with a scrungie or clean washcloth.  5.  Apply the CHG Soap to your body ONLY FROM THE NECK DOWN.   Do   not use on face/ open                           Wound or open sores. Avoid contact with eyes, ears mouth and   genitals (private parts).                       Wash face,  Genitals (private parts) with your normal soap.             6.  Wash thoroughly, paying special attention to the area where your    surgery  will be  performed.  7.  Thoroughly rinse your body with warm water from the neck down.  8.  DO NOT shower/wash with your normal soap after using and rinsing off the CHG Soap.                9.  Pat yourself dry with a clean towel.            10.  Wear clean pajamas.            11.  Place clean sheets on your bed the night of your first shower and do not  sleep with pets. Day of Surgery : Do not apply any lotions/deodorants the morning of surgery.  Please wear clean clothes to the hospital/surgery center.  FAILURE TO FOLLOW THESE INSTRUCTIONS MAY RESULT IN THE CANCELLATION OF YOUR SURGERY  PATIENT SIGNATURE_________________________________  NURSE SIGNATURE__________________________________  ________________________________________________________________________   Rogelia Mire  An incentive spirometer is a tool that can help keep your lungs clear and active. This tool measures how well you are filling your lungs with each breath. Taking long deep breaths may help reverse or decrease the chance of developing breathing (pulmonary) problems (especially infection) following: A long period of time when you are unable to move or be active. BEFORE THE PROCEDURE  If the spirometer includes an indicator to show your best effort, your nurse or respiratory therapist will set it to a desired goal. If possible, sit up straight or lean slightly forward. Try not to slouch. Hold the incentive spirometer in an upright position. INSTRUCTIONS FOR USE  Sit on the edge of your bed if possible, or sit up as far as you can in bed or on a chair. Hold the incentive spirometer in an upright position. Breathe out normally. Place the mouthpiece in your mouth and seal your lips tightly around it. Breathe in slowly and as deeply as possible, raising the piston or the ball toward the top of the column. Hold your breath for 3-5 seconds or for as long as possible. Allow the piston or ball to fall to the bottom of the  column. Remove the mouthpiece from your mouth and breathe out normally. Rest for a few seconds and repeat Steps 1 through 7 at least 10 times every 1-2 hours when you are awake. Take your time and take a  few normal breaths between deep breaths. The spirometer may include an indicator to show your best effort. Use the indicator as a goal to work toward during each repetition. After each set of 10 deep breaths, practice coughing to be sure your lungs are clear. If you have an incision (the cut made at the time of surgery), support your incision when coughing by placing a pillow or rolled up towels firmly against it. Once you are able to get out of bed, walk around indoors and cough well. You may stop using the incentive spirometer when instructed by your caregiver.  RISKS AND COMPLICATIONS Take your time so you do not get dizzy or light-headed. If you are in pain, you may need to take or ask for pain medication before doing incentive spirometry. It is harder to take a deep breath if you are having pain. AFTER USE Rest and breathe slowly and easily. It can be helpful to keep track of a log of your progress. Your caregiver can provide you with a simple table to help with this. If you are using the spirometer at home, follow these instructions: SEEK MEDICAL CARE IF:  You are having difficultly using the spirometer. You have trouble using the spirometer as often as instructed. Your pain medication is not giving enough relief while using the spirometer. You develop fever of 100.5 F (38.1 C) or higher. SEEK IMMEDIATE MEDICAL CARE IF:  You cough up bloody sputum that had not been present before. You develop fever of 102 F (38.9 C) or greater. You develop worsening pain at or near the incision site. MAKE SURE YOU:  Understand these instructions. Will watch your condition. Will get help right away if you are not doing well or get worse. Document Released: 08/12/2006 Document Revised: 06/24/2011  Document Reviewed: 10/13/2006 [facility] Patient Information 2014 [place], [place].   ________________________________________________________________________

## 2021-03-06 NOTE — Progress Notes (Addendum)
COVID swab appointment:  03-14-21  COVID Vaccine Completed:  Yes x2 Date COVID Vaccine completed: Has received booster:  No COVID vaccine manufacturer: Pfizer      Date of COVID positive in last 90 days: Np  PCP - Burnell Blanks, MD Cardiologist - N/A  Chest x-ray - N/A EKG - 03-07-21 Epic Stress Test - N/A ECHO - N/A Cardiac Cath - N/A Pacemaker/ICD device last checked: Spinal Cord Stimulator:  Sleep Study - N/A CPAP -   Fasting Blood Sugar - Prediabetic Checks Blood Sugar - does not check   Blood Thinner Instructions:N/A Aspirin Instructions: Last Dose:  Activity level:  Can go up a flight of stairs and perform activities of daily living without stopping and without symptoms of chest pain or shortness of breath.  Anesthesia review: N/A  Patient denies shortness of breath, fever, cough and chest pain at PAT appointment   Patient verbalized understanding of instructions that were given to them at the PAT appointment. Patient was also instructed that they will need to review over the PAT instructions again at home before surgery.

## 2021-03-07 ENCOUNTER — Encounter (HOSPITAL_COMMUNITY): Payer: Self-pay

## 2021-03-07 ENCOUNTER — Other Ambulatory Visit: Payer: Self-pay

## 2021-03-07 ENCOUNTER — Encounter (HOSPITAL_COMMUNITY)
Admission: RE | Admit: 2021-03-07 | Discharge: 2021-03-07 | Disposition: A | Discharge status: Home / Self Care | Payer: BC Managed Care – PPO | Source: Ambulatory Visit | Attending: Orthopaedic Surgery | Admitting: Orthopaedic Surgery

## 2021-03-07 VITALS — BP 134/84 | HR 80 | Temp 98.9°F | Resp 18 | Ht 65.0 in | Wt 203.4 lb

## 2021-03-07 DIAGNOSIS — Z79899 Other long term (current) drug therapy: Secondary | ICD-10-CM | POA: Insufficient documentation

## 2021-03-07 DIAGNOSIS — R7303 Prediabetes: Secondary | ICD-10-CM

## 2021-03-07 DIAGNOSIS — I2510 Atherosclerotic heart disease of native coronary artery without angina pectoris: Secondary | ICD-10-CM

## 2021-03-07 DIAGNOSIS — Z01818 Encounter for other preprocedural examination: Secondary | ICD-10-CM | POA: Insufficient documentation

## 2021-03-07 HISTORY — DX: Prediabetes: R73.03

## 2021-03-07 HISTORY — DX: Essential (primary) hypertension: I10

## 2021-03-07 LAB — BASIC METABOLIC PANEL
Anion gap: 7 (ref 5–15)
BUN: 17 mg/dL (ref 8–23)
CO2: 25 mmol/L (ref 22–32)
Calcium: 9.1 mg/dL (ref 8.9–10.3)
Chloride: 106 mmol/L (ref 98–111)
Creatinine, Ser: 0.67 mg/dL (ref 0.44–1.00)
GFR, Estimated: >60 mL/min (ref >60)
Glucose, Bld: 116 mg/dL — ABNORMAL HIGH (ref 70–99)
Potassium: 4 mmol/L (ref 3.5–5.1)
Sodium: 138 mmol/L (ref 135–145)

## 2021-03-07 LAB — CBC
HCT: 32.4 % — ABNORMAL LOW (ref 36.0–46.0)
Hemoglobin: 10.3 g/dL — ABNORMAL LOW (ref 12.0–15.0)
MCH: 26.8 pg (ref 26.0–34.0)
MCHC: 31.8 g/dL (ref 30.0–36.0)
MCV: 84.4 fL (ref 80.0–100.0)
Platelets: 292 10*3/uL (ref 150–400)
RBC: 3.84 MIL/uL — ABNORMAL LOW (ref 3.87–5.11)
RDW: 13.1 % (ref 11.5–15.5)
WBC: 6.4 10*3/uL (ref 4.0–10.5)
nRBC: 0 % (ref 0.0–0.2)

## 2021-03-07 LAB — HEMOGLOBIN A1C
Hgb A1c MFr Bld: 5.9 % — ABNORMAL HIGH (ref 4.8–5.6)
Mean Plasma Glucose: 122.63 mg/dL

## 2021-03-07 LAB — SURGICAL PCR SCREEN
MRSA, PCR: NEGATIVE
Staphylococcus aureus: NEGATIVE

## 2021-03-07 LAB — GLUCOSE, CAPILLARY: Glucose-Capillary: 129 mg/dL — ABNORMAL HIGH (ref 70–99)

## 2021-03-14 ENCOUNTER — Other Ambulatory Visit: Payer: Self-pay | Admitting: Orthopaedic Surgery

## 2021-03-14 LAB — SARS CORONAVIRUS 2 (TAT 6-24 HRS): SARS Coronavirus 2: NEGATIVE

## 2021-03-15 ENCOUNTER — Encounter (HOSPITAL_COMMUNITY): Payer: Self-pay | Admitting: Orthopaedic Surgery

## 2021-03-15 NOTE — Anesthesia Preprocedure Evaluation (Addendum)
Anesthesia Evaluation  Patient identified by MRN, date of birth, ID band Patient awake    Reviewed: Allergy & Precautions, NPO status , Patient's Chart, lab work & pertinent test results  History of Anesthesia Complications (+) PONV and Family history of anesthesia reaction  Airway Mallampati: I       Dental no notable dental hx.    Pulmonary neg pulmonary ROS,    Pulmonary exam normal        Cardiovascular hypertension, Pt. on medications Normal cardiovascular exam     Neuro/Psych negative neurological ROS  negative psych ROS   GI/Hepatic negative GI ROS, Neg liver ROS,   Endo/Other  Hypothyroidism   Renal/GU negative Renal ROS  negative genitourinary   Musculoskeletal  (+) Arthritis , Osteoarthritis,    Abdominal (+) + obese,   Peds  Hematology  (+) anemia ,   Anesthesia Other Findings   Reproductive/Obstetrics                            Anesthesia Physical Anesthesia Plan  ASA: 2  Anesthesia Plan: Spinal   Post-op Pain Management: Regional block   Induction:   PONV Risk Score and Plan: 3 and Ondansetron, Propofol infusion, Midazolam and Treatment may vary due to age or medical condition  Airway Management Planned: Natural Airway and Simple Face Mask  Additional Equipment: None  Intra-op Plan:   Post-operative Plan:   Informed Consent: I have reviewed the patients History and Physical, chart, labs and discussed the procedure including the risks, benefits and alternatives for the proposed anesthesia with the patient or authorized representative who has indicated his/her understanding and acceptance.       Plan Discussed with: CRNA  Anesthesia Plan Comments:        Anesthesia Quick Evaluation

## 2021-03-15 NOTE — H&P (Signed)
TOTAL KNEE ADMISSION H&P  Patient is being admitted for left total knee arthroplasty.  Subjective:  Chief Complaint:left knee pain.  HPI: Sophia Adams, 65 y.o. female, has a history of pain and functional disability in the left knee due to arthritis and has failed non-surgical conservative treatments for greater than 12 weeks to includeNSAID's and/or analgesics, corticosteriod injections, viscosupplementation injections, flexibility and strengthening excercises, and activity modification.  Onset of symptoms was gradual, starting 3 years ago with gradually worsening course since that time. The patient noted no past surgery on the left knee(s).  Patient currently rates pain in the left knee(s) at 10 out of 10 with activity. Patient has night pain, worsening of pain with activity and weight bearing, pain that interferes with activities of daily living, pain with passive range of motion, crepitus, and joint swelling.  Patient has evidence of subchondral sclerosis, periarticular osteophytes, and joint space narrowing by imaging studies. There is no active infection.  Patient Active Problem List   Diagnosis Date Noted   Unilateral primary osteoarthritis, left knee 01/11/2021   Osteoarthritis of right knee medial compartment 03/31/2015   Status post right partial knee replacement 03/31/2015   Past Medical History:  Diagnosis Date   Anemia 21 yrs ago   Arthritis    oa   Family history of adverse reaction to anesthesia    both parents get ponv   Hypertension    Hypothyroidism    Pre-diabetes     Past Surgical History:  Procedure Laterality Date   BONE EXOSTOSIS EXCISION Right 03/31/2015   Procedure: EXOSTOSIS EXCISION RIGHT FOOT;  Surgeon: Kathryne Hitch, MD;  Location: [facility];  Service: Orthopedics;  Laterality: Right;   BREAST BIOPSY Left 1990 and 1986   benign   BREAST EXCISIONAL BIOPSY     PARTIAL KNEE ARTHROPLASTY Right 03/31/2015   Procedure: RIGHT UNICOMPARTMENTAL KNEE  ARTHROPLASTY;  Surgeon: Kathryne Hitch, MD;  Location: [facility];  Service: Orthopedics;  Laterality: Right;   right knee arthroscopy  dec 2015   TONSILLECTOMY  1965   TUBAL LIGATION  1995    No current facility-administered medications for this encounter.   Current Outpatient Medications  Medication Sig Dispense Refill Last Dose   enalapril (VASOTEC) 5 MG tablet Take 7.5 mg by mouth daily.      rosuvastatin (CRESTOR) 5 MG tablet Take 5 mg by mouth at bedtime.      SYNTHROID 88 MCG tablet Take 88 mcg by mouth daily before breakfast.      Not on File  Social History   Tobacco Use   Smoking status: Never   Smokeless tobacco: Never  Substance Use Topics   Alcohol use: No    Family History  Problem Relation Age of Onset   Breast cancer Neg Hx      Review of Systems  Musculoskeletal:  Positive for joint swelling.  All other systems reviewed and are negative.  Objective:  Physical Exam Vitals reviewed.  Constitutional:      Appearance: Normal appearance.  HENT:     Head: Normocephalic and atraumatic.  Eyes:     Extraocular Movements: Extraocular movements intact.     Pupils: Pupils are equal, round, and reactive to light.  Cardiovascular:     Rate and Rhythm: Normal rate and regular rhythm.  Pulmonary:     Effort: Pulmonary effort is normal.     Breath sounds: Normal breath sounds.  Abdominal:     Palpations: Abdomen is soft.  Musculoskeletal:  Cervical back: Normal range of motion and neck supple.     Left knee: Effusion, bony tenderness and crepitus present. Decreased range of motion. Tenderness present over the medial joint line, lateral joint line and patellar tendon. Abnormal alignment and abnormal meniscus.  Neurological:     Mental Status: She is alert and oriented to person, place, and time.  Psychiatric:        Behavior: Behavior normal.    Vital signs in last 24 hours:    Labs:   Estimated body mass index is 33.85 kg/m as calculated from  the following:   Height as of 03/07/21: 5\' 5"  (1.651 m).   Weight as of 03/07/21: 92.3 kg.   Imaging Review Plain radiographs demonstrate severe degenerative joint disease of the left knee(s). The overall alignment ismild varus. The bone quality appears to be excellent for age and reported activity level.      Assessment/Plan:  End stage arthritis, left knee   The patient history, physical examination, clinical judgment of the provider and imaging studies are consistent with end stage degenerative joint disease of the left knee(s) and total knee arthroplasty is deemed medically necessary. The treatment options including medical management, injection therapy arthroscopy and arthroplasty were discussed at length. The risks and benefits of total knee arthroplasty were presented and reviewed. The risks due to aseptic loosening, infection, stiffness, patella tracking problems, thromboembolic complications and other imponderables were discussed. The patient acknowledged the explanation, agreed to proceed with the plan and consent was signed. Patient is being admitted for inpatient treatment for surgery, pain control, PT, OT, prophylactic antibiotics, VTE prophylaxis, progressive ambulation and ADL's and discharge planning. The patient is planning to be discharged home with home health services

## 2021-03-16 ENCOUNTER — Encounter (HOSPITAL_COMMUNITY): Payer: Self-pay | Admitting: Orthopaedic Surgery

## 2021-03-16 ENCOUNTER — Ambulatory Visit (HOSPITAL_COMMUNITY): Payer: BC Managed Care – PPO | Admitting: Anesthesiology

## 2021-03-16 ENCOUNTER — Encounter (HOSPITAL_COMMUNITY)
Admission: RE | Disposition: A | Discharge status: Home Health | Payer: Self-pay | Source: Ambulatory Visit | Attending: Orthopaedic Surgery

## 2021-03-16 ENCOUNTER — Observation Stay (HOSPITAL_COMMUNITY)
Admission: RE | Admit: 2021-03-16 | Discharge: 2021-03-17 | Disposition: A | Discharge status: Home Health | Payer: BC Managed Care – PPO | Source: Ambulatory Visit | Attending: Orthopaedic Surgery | Admitting: Orthopaedic Surgery

## 2021-03-16 ENCOUNTER — Observation Stay (HOSPITAL_COMMUNITY): Payer: BC Managed Care – PPO

## 2021-03-16 ENCOUNTER — Other Ambulatory Visit: Payer: Self-pay

## 2021-03-16 DIAGNOSIS — Z79899 Other long term (current) drug therapy: Secondary | ICD-10-CM | POA: Insufficient documentation

## 2021-03-16 DIAGNOSIS — I10 Essential (primary) hypertension: Secondary | ICD-10-CM | POA: Diagnosis not present

## 2021-03-16 DIAGNOSIS — E039 Hypothyroidism, unspecified: Secondary | ICD-10-CM | POA: Insufficient documentation

## 2021-03-16 DIAGNOSIS — Z96652 Presence of left artificial knee joint: Secondary | ICD-10-CM

## 2021-03-16 DIAGNOSIS — R7303 Prediabetes: Secondary | ICD-10-CM | POA: Insufficient documentation

## 2021-03-16 DIAGNOSIS — M1712 Unilateral primary osteoarthritis, left knee: Secondary | ICD-10-CM | POA: Diagnosis not present

## 2021-03-16 HISTORY — PX: TOTAL KNEE ARTHROPLASTY: SHX125

## 2021-03-16 LAB — GLUCOSE, CAPILLARY: Glucose-Capillary: 124 mg/dL — ABNORMAL HIGH (ref 70–99)

## 2021-03-16 SURGERY — ARTHROPLASTY, KNEE, TOTAL
Anesthesia: Spinal | Site: Knee | Laterality: Left

## 2021-03-16 MED ORDER — 0.9 % SODIUM CHLORIDE (POUR BTL) OPTIME
TOPICAL | Status: DC | PRN
  Administered 2021-03-16: 1000 mL

## 2021-03-16 MED ORDER — OXYCODONE HCL 5 MG PO TABS: 10.0000 mg | ORAL_TABLET | ORAL | Status: DC | PRN

## 2021-03-16 MED ORDER — ENALAPRIL MALEATE 5 MG PO TABS
7.5000 mg | ORAL_TABLET | Freq: Every day | ORAL | Status: DC
  Filled 2021-03-16: qty 1

## 2021-03-16 MED ORDER — ALUM & MAG HYDROXIDE-SIMETH 200-200-20 MG/5ML PO SUSP
30.0000 mL | ORAL | Status: DC | PRN
  Administered 2021-03-17: 30 mL via ORAL
  Filled 2021-03-16: qty 30

## 2021-03-16 MED ORDER — ONDANSETRON HCL 4 MG/2ML IJ SOLN
INTRAMUSCULAR | Status: DC | PRN
  Administered 2021-03-16: 4 mg via INTRAVENOUS

## 2021-03-16 MED ORDER — DOCUSATE SODIUM 100 MG PO CAPS
100.0000 mg | ORAL_CAPSULE | Freq: Two times a day (BID) | ORAL | Status: DC
  Administered 2021-03-16 – 2021-03-17 (×2): 100 mg via ORAL
  Filled 2021-03-16 (×2): qty 1

## 2021-03-16 MED ORDER — LIDOCAINE HCL (PF) 2 % IJ SOLN
INTRAMUSCULAR | Status: AC
  Filled 2021-03-16: qty 5

## 2021-03-16 MED ORDER — POLYETHYLENE GLYCOL 3350 17 G PO PACK: 17.0000 g | PACK | Freq: Every day | ORAL | Status: DC | PRN

## 2021-03-16 MED ORDER — PANTOPRAZOLE SODIUM 40 MG PO TBEC
40.0000 mg | DELAYED_RELEASE_TABLET | Freq: Every day | ORAL | Status: DC
  Administered 2021-03-16 – 2021-03-17 (×2): 40 mg via ORAL
  Filled 2021-03-16 (×2): qty 1

## 2021-03-16 MED ORDER — ONDANSETRON HCL 4 MG/2ML IJ SOLN: 4.0000 mg | Freq: Four times a day (QID) | INTRAMUSCULAR | Status: DC | PRN

## 2021-03-16 MED ORDER — PROPOFOL 1000 MG/100ML IV EMUL
INTRAVENOUS | Status: AC
  Filled 2021-03-16: qty 100

## 2021-03-16 MED ORDER — PHENYLEPHRINE HCL-NACL 20-0.9 MG/250ML-% IV SOLN
INTRAVENOUS | Status: AC
  Filled 2021-03-16: qty 750

## 2021-03-16 MED ORDER — METHOCARBAMOL 500 MG PO TABS
500.0000 mg | ORAL_TABLET | Freq: Four times a day (QID) | ORAL | Status: DC | PRN
  Administered 2021-03-16 (×2): 500 mg via ORAL
  Filled 2021-03-16 (×2): qty 1

## 2021-03-16 MED ORDER — ACETAMINOPHEN 10 MG/ML IV SOLN: 1000.0000 mg | Freq: Once | INTRAVENOUS | Status: DC | PRN

## 2021-03-16 MED ORDER — ASPIRIN 81 MG PO CHEW
81.0000 mg | CHEWABLE_TABLET | Freq: Two times a day (BID) | ORAL | Status: DC
  Administered 2021-03-16 – 2021-03-17 (×2): 81 mg via ORAL
  Filled 2021-03-16 (×2): qty 1

## 2021-03-16 MED ORDER — MIDAZOLAM HCL 5 MG/5ML IJ SOLN
INTRAMUSCULAR | Status: DC | PRN
  Administered 2021-03-16: 2 mg via INTRAVENOUS

## 2021-03-16 MED ORDER — METOCLOPRAMIDE HCL 5 MG/ML IJ SOLN: 5.0000 mg | Freq: Three times a day (TID) | INTRAMUSCULAR | Status: DC | PRN

## 2021-03-16 MED ORDER — LACTATED RINGERS IV SOLN: INTRAVENOUS | Status: DC

## 2021-03-16 MED ORDER — PHENOL 1.4 % MT LIQD: 1.0000 | OROMUCOSAL | Status: DC | PRN

## 2021-03-16 MED ORDER — CHLORHEXIDINE GLUCONATE 0.12 % MT SOLN
15.0000 mL | Freq: Once | OROMUCOSAL | Status: AC
  Administered 2021-03-16: 15 mL via OROMUCOSAL

## 2021-03-16 MED ORDER — ACETAMINOPHEN 325 MG PO TABS: 325.0000 mg | ORAL_TABLET | Freq: Four times a day (QID) | ORAL | Status: DC | PRN

## 2021-03-16 MED ORDER — PHENYLEPHRINE 40 MCG/ML (10ML) SYRINGE FOR IV PUSH (FOR BLOOD PRESSURE SUPPORT)
PREFILLED_SYRINGE | INTRAVENOUS | Status: DC | PRN
  Administered 2021-03-16 (×2): 40 ug via INTRAVENOUS
  Administered 2021-03-16: 80 ug via INTRAVENOUS
  Administered 2021-03-16 (×2): 40 ug via INTRAVENOUS
  Administered 2021-03-16: 80 ug via INTRAVENOUS
  Administered 2021-03-16 (×2): 40 ug via INTRAVENOUS

## 2021-03-16 MED ORDER — KETOROLAC TROMETHAMINE 15 MG/ML IJ SOLN
7.5000 mg | Freq: Four times a day (QID) | INTRAMUSCULAR | Status: AC
  Administered 2021-03-16 – 2021-03-17 (×3): 7.5 mg via INTRAVENOUS
  Filled 2021-03-16 (×3): qty 1

## 2021-03-16 MED ORDER — HYDROMORPHONE HCL 1 MG/ML IJ SOLN
0.5000 mg | INTRAMUSCULAR | Status: DC | PRN
  Administered 2021-03-16: 0.5 mg via INTRAVENOUS
  Filled 2021-03-16: qty 1

## 2021-03-16 MED ORDER — OXYCODONE HCL 5 MG PO TABS
5.0000 mg | ORAL_TABLET | ORAL | Status: DC | PRN
  Administered 2021-03-16: 10 mg via ORAL
  Administered 2021-03-16 (×2): 5 mg via ORAL
  Administered 2021-03-17: 10 mg via ORAL
  Filled 2021-03-16 (×2): qty 2
  Filled 2021-03-16 (×2): qty 1

## 2021-03-16 MED ORDER — EPHEDRINE SULFATE-NACL 50-0.9 MG/10ML-% IV SOSY
PREFILLED_SYRINGE | INTRAVENOUS | Status: DC | PRN
  Administered 2021-03-16: 5 mg via INTRAVENOUS

## 2021-03-16 MED ORDER — ONDANSETRON HCL 4 MG PO TABS: 4.0000 mg | ORAL_TABLET | Freq: Four times a day (QID) | ORAL | Status: DC | PRN

## 2021-03-16 MED ORDER — CEFAZOLIN SODIUM-DEXTROSE 2-4 GM/100ML-% IV SOLN
2.0000 g | INTRAVENOUS | Status: AC
  Administered 2021-03-16: 2 g via INTRAVENOUS
  Filled 2021-03-16: qty 100

## 2021-03-16 MED ORDER — PROPOFOL 10 MG/ML IV BOLUS
INTRAVENOUS | Status: AC
  Filled 2021-03-16: qty 20

## 2021-03-16 MED ORDER — MENTHOL 3 MG MT LOZG: 1.0000 | LOZENGE | OROMUCOSAL | Status: DC | PRN

## 2021-03-16 MED ORDER — LEVOTHYROXINE SODIUM 88 MCG PO TABS
88.0000 ug | ORAL_TABLET | Freq: Every day | ORAL | Status: DC
  Administered 2021-03-17: 88 ug via ORAL
  Filled 2021-03-16: qty 1

## 2021-03-16 MED ORDER — DEXAMETHASONE SODIUM PHOSPHATE 10 MG/ML IJ SOLN
INTRAMUSCULAR | Status: AC
  Filled 2021-03-16: qty 1

## 2021-03-16 MED ORDER — SODIUM CHLORIDE 0.9 % IV SOLN: INTRAVENOUS | Status: DC

## 2021-03-16 MED ORDER — LIDOCAINE 2% (20 MG/ML) 5 ML SYRINGE
INTRAMUSCULAR | Status: DC | PRN
  Administered 2021-03-16: 40 mg via INTRAVENOUS

## 2021-03-16 MED ORDER — MEPERIDINE HCL 50 MG/ML IJ SOLN: 6.2500 mg | INTRAMUSCULAR | Status: DC | PRN

## 2021-03-16 MED ORDER — CLONIDINE HCL (ANALGESIA) 100 MCG/ML EP SOLN
EPIDURAL | Status: DC | PRN
  Administered 2021-03-16: 100 ug

## 2021-03-16 MED ORDER — EPHEDRINE 5 MG/ML INJ
INTRAVENOUS | Status: AC
  Filled 2021-03-16: qty 5

## 2021-03-16 MED ORDER — FENTANYL CITRATE (PF) 100 MCG/2ML IJ SOLN
INTRAMUSCULAR | Status: AC
  Filled 2021-03-16: qty 2

## 2021-03-16 MED ORDER — DEXAMETHASONE SODIUM PHOSPHATE 10 MG/ML IJ SOLN
INTRAMUSCULAR | Status: DC | PRN
  Administered 2021-03-16: 10 mg

## 2021-03-16 MED ORDER — KETOROLAC TROMETHAMINE 15 MG/ML IJ SOLN: 15.0000 mg | Freq: Once | INTRAMUSCULAR | Status: DC

## 2021-03-16 MED ORDER — ORAL CARE MOUTH RINSE: 15.0000 mL | Freq: Once | OROMUCOSAL | Status: AC

## 2021-03-16 MED ORDER — DEXAMETHASONE SODIUM PHOSPHATE 10 MG/ML IJ SOLN
INTRAMUSCULAR | Status: DC | PRN
Start: 2021-03-16 — End: 2021-03-16
  Administered 2021-03-16: 4 mg via INTRAVENOUS

## 2021-03-16 MED ORDER — FENTANYL CITRATE (PF) 100 MCG/2ML IJ SOLN
INTRAMUSCULAR | Status: DC | PRN
  Administered 2021-03-16: 100 ug via INTRAVENOUS

## 2021-03-16 MED ORDER — TRANEXAMIC ACID-NACL 1000-0.7 MG/100ML-% IV SOLN
1000.0000 mg | INTRAVENOUS | Status: AC
  Administered 2021-03-16: 1000 mg via INTRAVENOUS
  Filled 2021-03-16: qty 100

## 2021-03-16 MED ORDER — EPHEDRINE 5 MG/ML INJ
INTRAVENOUS | Status: AC
  Filled 2021-03-16: qty 10

## 2021-03-16 MED ORDER — PROPOFOL 500 MG/50ML IV EMUL
INTRAVENOUS | Status: DC | PRN
  Administered 2021-03-16: 75 ug/kg/min via INTRAVENOUS

## 2021-03-16 MED ORDER — LIDOCAINE HCL (PF) 2 % IJ SOLN
INTRAMUSCULAR | Status: AC
  Filled 2021-03-16: qty 10

## 2021-03-16 MED ORDER — BUPIVACAINE IN DEXTROSE 0.75-8.25 % IT SOLN
INTRATHECAL | Status: DC | PRN
  Administered 2021-03-16: 1.4 mL via INTRATHECAL

## 2021-03-16 MED ORDER — SODIUM CHLORIDE 0.9 % IR SOLN
Status: DC | PRN
  Administered 2021-03-16: 1000 mL

## 2021-03-16 MED ORDER — POVIDONE-IODINE 10 % EX SWAB
2.0000 "application " | Freq: Once | CUTANEOUS | Status: AC
  Administered 2021-03-16: 2 via TOPICAL

## 2021-03-16 MED ORDER — MIDAZOLAM HCL 2 MG/2ML IJ SOLN
INTRAMUSCULAR | Status: AC
  Filled 2021-03-16: qty 2

## 2021-03-16 MED ORDER — BUPIVACAINE-EPINEPHRINE 0.25% -1:200000 IJ SOLN
INTRAMUSCULAR | Status: DC | PRN
  Administered 2021-03-16: 30 mL

## 2021-03-16 MED ORDER — PHENYLEPHRINE 40 MCG/ML (10ML) SYRINGE FOR IV PUSH (FOR BLOOD PRESSURE SUPPORT)
PREFILLED_SYRINGE | INTRAVENOUS | Status: AC
  Filled 2021-03-16: qty 10

## 2021-03-16 MED ORDER — PROMETHAZINE HCL 25 MG/ML IJ SOLN: 6.2500 mg | INTRAMUSCULAR | Status: DC | PRN

## 2021-03-16 MED ORDER — BUPIVACAINE-EPINEPHRINE (PF) 0.25% -1:200000 IJ SOLN
INTRAMUSCULAR | Status: AC
  Filled 2021-03-16: qty 30

## 2021-03-16 MED ORDER — METHOCARBAMOL 500 MG IVPB - SIMPLE MED
500.0000 mg | Freq: Four times a day (QID) | INTRAVENOUS | Status: DC | PRN
  Filled 2021-03-16: qty 50

## 2021-03-16 MED ORDER — STERILE WATER FOR IRRIGATION IR SOLN
Status: DC | PRN
  Administered 2021-03-16 (×2): 1000 mL

## 2021-03-16 MED ORDER — ONDANSETRON HCL 4 MG/2ML IJ SOLN
INTRAMUSCULAR | Status: AC
  Filled 2021-03-16: qty 2

## 2021-03-16 MED ORDER — CEFAZOLIN SODIUM-DEXTROSE 1-4 GM/50ML-% IV SOLN
1.0000 g | Freq: Four times a day (QID) | INTRAVENOUS | Status: AC
  Administered 2021-03-16 (×2): 1 g via INTRAVENOUS
  Filled 2021-03-16 (×2): qty 50

## 2021-03-16 MED ORDER — METOCLOPRAMIDE HCL 5 MG PO TABS: 5.0000 mg | ORAL_TABLET | Freq: Three times a day (TID) | ORAL | Status: DC | PRN

## 2021-03-16 MED ORDER — DIPHENHYDRAMINE HCL 12.5 MG/5ML PO ELIX: 12.5000 mg | ORAL_SOLUTION | ORAL | Status: DC | PRN

## 2021-03-16 MED ORDER — HYDROMORPHONE HCL 1 MG/ML IJ SOLN: 0.2500 mg | INTRAMUSCULAR | Status: DC | PRN

## 2021-03-16 MED ORDER — ROPIVACAINE HCL 5 MG/ML IJ SOLN
INTRAMUSCULAR | Status: DC | PRN
  Administered 2021-03-16 (×6): 5 mL via PERINEURAL

## 2021-03-16 SURGICAL SUPPLY — 57 items
BAG COUNTER SPONGE SURGICOUNT (BAG) IMPLANT
BAG ZIPLOCK 12X15 (MISCELLANEOUS) ×2 IMPLANT
BENZOIN TINCTURE PRP APPL 2/3 (GAUZE/BANDAGES/DRESSINGS) IMPLANT
BLADE SAG 18X100X1.27 (BLADE) ×2 IMPLANT
BLADE SURG SZ10 CARB STEEL (BLADE) ×4 IMPLANT
BNDG ELASTIC 6X5.8 VLCR STR LF (GAUZE/BANDAGES/DRESSINGS) ×4 IMPLANT
BOWL SMART MIX CTS (DISPOSABLE) IMPLANT
CEMENT BONE REFOBACIN R1X40 US (Cement) ×4 IMPLANT
COOLER ICEMAN CLASSIC (MISCELLANEOUS) ×2 IMPLANT
COVER SURGICAL LIGHT HANDLE (MISCELLANEOUS) ×2 IMPLANT
CUFF TOURN SGL QUICK 34 (TOURNIQUET CUFF) ×2
CUFF TRNQT CYL 34X4.125X (TOURNIQUET CUFF) ×1 IMPLANT
DECANTER SPIKE VIAL GLASS SM (MISCELLANEOUS) IMPLANT
DRAPE INCISE IOBAN 66X45 STRL (DRAPES) ×2 IMPLANT
DRAPE U-SHAPE 47X51 STRL (DRAPES) ×2 IMPLANT
DRSG PAD ABDOMINAL 8X10 ST (GAUZE/BANDAGES/DRESSINGS) ×4 IMPLANT
DURAPREP 26ML APPLICATOR (WOUND CARE) ×2 IMPLANT
ELECT BLADE TIP CTD 4 INCH (ELECTRODE) ×2 IMPLANT
ELECT REM PT RETURN 15FT ADLT (MISCELLANEOUS) ×2 IMPLANT
FEMUR CMT CR STD SZ 6 LT KNEE (Joint) ×2 IMPLANT
FEMUR CMTD CR STD SZ 6 LT KNEE (Joint) ×1 IMPLANT
GAUZE SPONGE 4X4 12PLY STRL (GAUZE/BANDAGES/DRESSINGS) ×2 IMPLANT
GAUZE XEROFORM 1X8 LF (GAUZE/BANDAGES/DRESSINGS) IMPLANT
GLOVE SRG 8 PF TXTR STRL LF DI (GLOVE) ×2 IMPLANT
GLOVE SURG ENC MOIS LTX SZ7.5 (GLOVE) ×2 IMPLANT
GLOVE SURG NEOPR MICRO LF SZ8 (GLOVE) ×2 IMPLANT
GLOVE SURG UNDER POLY LF SZ8 (GLOVE) ×4
GOWN STRL REUS W/TWL XL LVL3 (GOWN DISPOSABLE) ×4 IMPLANT
HANDPIECE INTERPULSE COAX TIP (DISPOSABLE) ×2
HDLS TROCR DRIL PIN KNEE 75 (PIN) ×8
HOLDER FOLEY CATH W/STRAP (MISCELLANEOUS) IMPLANT
IMMOBILIZER KNEE 20 (SOFTGOODS) ×2
IMMOBILIZER KNEE 20 THIGH 36 (SOFTGOODS) ×1 IMPLANT
KIT TURNOVER KIT A (KITS) IMPLANT
NS IRRIG 1000ML POUR BTL (IV SOLUTION) ×2 IMPLANT
PACK TOTAL KNEE CUSTOM (KITS) ×2 IMPLANT
PAD COLD SHLDR WRAP-ON (PAD) ×2 IMPLANT
PADDING CAST COTTON 6X4 STRL (CAST SUPPLIES) ×4 IMPLANT
PIN DRILL HDLS TROCAR 75 4PK (PIN) ×4 IMPLANT
PROTECTOR NERVE ULNAR (MISCELLANEOUS) ×2 IMPLANT
SCREW FEMALE HEX FIX 25X2.5 (ORTHOPEDIC DISPOSABLE SUPPLIES) ×2 IMPLANT
SET HNDPC FAN SPRY TIP SCT (DISPOSABLE) ×1 IMPLANT
SET PAD KNEE POSITIONER (MISCELLANEOUS) ×2 IMPLANT
STAPLER VISISTAT 35W (STAPLE) IMPLANT
STEM POLY PAT PLY 32M KNEE (Knees) ×2 IMPLANT
STEM TIBIA 5 DEG SZ E L KNEE (Knees) ×1 IMPLANT
STEM TIBIAL SZ6-7 E-F10 (Stem) ×2 IMPLANT
STRIP CLOSURE SKIN 1/2X4 (GAUZE/BANDAGES/DRESSINGS) IMPLANT
SUT MNCRL AB 4-0 PS2 18 (SUTURE) IMPLANT
SUT VIC AB 0 CT1 27 (SUTURE) ×2
SUT VIC AB 0 CT1 27XBRD ANTBC (SUTURE) ×1 IMPLANT
SUT VIC AB 1 CT1 36 (SUTURE) ×6 IMPLANT
SUT VIC AB 2-0 CT1 27 (SUTURE) ×4
SUT VIC AB 2-0 CT1 TAPERPNT 27 (SUTURE) ×2 IMPLANT
TIBIA STEM 5 DEG SZ E L KNEE (Knees) ×2 IMPLANT
TRAY FOLEY MTR SLVR 16FR STAT (SET/KITS/TRAYS/PACK) IMPLANT
WATER STERILE IRR 1000ML POUR (IV SOLUTION) ×4 IMPLANT

## 2021-03-16 NOTE — Brief Op Note (Signed)
03/16/2021  8:47 AM  PATIENT:  Sophia Adams  65 y.o. female  PRE-OPERATIVE DIAGNOSIS:  left knee osteoarthritis  POST-OPERATIVE DIAGNOSIS:  left knee osteoarthritis  PROCEDURE:  Procedure(s): LEFT TOTAL KNEE ARTHROPLASTY (Left)  SURGEON:  Surgeon(s) and Role:    Kathryne Hitch, MD - Primary  PHYSICIAN ASSISTANT: Rexene Edison, PA-C  ANESTHESIA:   local, regional, and spinal  EBL:  50 mL   COUNTS:  YES  TOURNIQUET:   Total Tourniquet Time Documented: Thigh (Left) - 52 minutes Total: Thigh (Left) - 52 minutes   DICTATION: .Other Dictation: Dictation Number [id number]  PLAN OF CARE: Admit for overnight observation  PATIENT DISPOSITION:  PACU - hemodynamically stable.   Delay start of Pharmacological VTE agent (>24hrs) due to surgical blood loss or risk of bleeding: no

## 2021-03-16 NOTE — Anesthesia Procedure Notes (Signed)
Anesthesia Regional Block: Adductor canal block   Pre-Anesthetic Checklist: , timeout performed,  Correct Patient, Correct Site, Correct Laterality,  Correct Procedure, Correct Position, site marked,  Risks and benefits discussed,  Surgical consent,  Pre-op evaluation,  At surgeon's request and post-op pain management  Laterality: Lower and Left  Prep: chloraprep       Needles:  Injection technique: Single-shot  Needle Type: Echogenic Stimulator Needle     Needle Length: 9cm  Needle Gauge: 20   Needle insertion depth: 3 cm   Additional Needles:   Procedures:,,,, ultrasound used (permanent image in chart),,    Narrative:  Start time: 03/16/2021 6:58 AM End time: 03/16/2021 7:07 AM Injection made incrementally with aspirations every 5 mL.  Performed by: Personally  Anesthesiologist: Leilani Able, MD

## 2021-03-16 NOTE — Interval H&P Note (Signed)
History and Physical Interval Note: The patient understands that she is here today for a left knee replacement to treat her left knee osteoarthritis.  There has been no acute or interval change in her medical status.  Please see recent H&P.  The risks and benefits of surgery been explained in detail and informed consent was obtained.  The left operative knee has been marked.  03/16/2021 7:03 AM  Sophia Adams  has presented today for surgery, with the diagnosis of left knee osteoarthritis.  The various methods of treatment have been discussed with the patient and family. After consideration of risks, benefits and other options for treatment, the patient has consented to  Procedure(s): LEFT TOTAL KNEE ARTHROPLASTY (Left) as a surgical intervention.  The patient's history has been reviewed, patient examined, no change in status, stable for surgery.  I have reviewed the patient's chart and labs.  Questions were answered to the patient's satisfaction.     Kathryne Hitch

## 2021-03-16 NOTE — Anesthesia Procedure Notes (Signed)
Spinal  Patient location during procedure: OR Start time: 03/16/2021 7:17 AM End time: 03/16/2021 7:21 AM Reason for block: surgical anesthesia Staffing Performed: anesthesiologist  Anesthesiologist: Leilani Able, MD Preanesthetic Checklist Completed: patient identified, IV checked, site marked, risks and benefits discussed, surgical consent, monitors and equipment checked, pre-op evaluation and timeout performed Spinal Block Patient position: sitting Prep: DuraPrep and site prepped and draped Patient monitoring: continuous pulse ox and blood pressure Approach: midline Location: L3-4 Injection technique: single-shot Needle Needle type: Pencan  Needle gauge: 24 G Needle length: 10 cm Needle insertion depth: 7 cm Assessment Sensory level: T8 Events: CSF return

## 2021-03-16 NOTE — Op Note (Signed)
NAME: Sophia Adams, BANKA MEDICAL RECORD NO: [id number] ACCOUNT NO: [id number] DATE OF BIRTH: 1955-09-25 FACILITY: [facility] LOCATION: [facility] PHYSICIAN: Vanita Panda. Magnus Ivan, MD  Operative Report   DATE OF PROCEDURE: 03/16/2021  PREOPERATIVE DIAGNOSES:  Primary osteoarthritis and degenerative joint disease, left knee.  POSTOPERATIVE DIAGNOSES:  Primary osteoarthritis and degenerative joint disease, left knee.  PROCEDURE:  Left total knee arthroplasty.  IMPLANTS:  Biomet/Zimmer Persona cemented knee system with size 6 left CR femur, size E left tibial tray, 10 mm medial congruent fixed bearing polyethylene insert, size 32 patellar button.  SURGEON:  Vanita Panda. Magnus Ivan, MD  ASSISTANT:  Richardean Canal, PA-C  ANESTHESIA: 1.  Left lower extremity adductor canal block. 2.  Spinal. 3.  Local with 0.25% Marcaine with epinephrine around the arthrotomy.  TOURNIQUET TIME:  Under one hour.  ANTIBIOTICS:  2 g IV Ancef.  BLOOD LOSS:  Less than 100 mL  COMPLICATIONS:  None.  INDICATIONS:  The patient is a 65 year old female well known to me.  She has well documented debilitating arthritis involving her left knee, and we have seen her for many years now for that knee.  She has tried and failed all forms of conservative  treatment and at this point, her left knee pain is daily and it is detrimentally affecting her mobility, her activities of daily living, her quality of life.  Her x-rays show varus malalignment and severe arthritis of the medial compartment of the knee  as well as patellofemoral joint.  She has had a partial knee arthroplasty on the right side, which we did years ago.  The left side has more significant tricompartmental arthritis, so partial knee is not indicated and she understands that as well.  We  described the risk of acute blood loss anemia, nerve and vessel injury, fracture, infection, DVT, implant failure, skin and soft tissue issues.  We talked about our goals being  decrease pain, improve mobility and overall improve quality of life.  DESCRIPTION OF PROCEDURE:  After informed consent was obtained, appropriate left knee was marked and adductor canal block was obtained in the left lower extremity in the holding room.  She was then brought to the operating room and sat up on the  operating table.  Spinal anesthesia was obtained.  She was laid ____ on the operating table in the supine position.  A Foley catheter was placed and a nonsterile tourniquet was placed around her upper left thigh.  Her left thigh, knee, leg, ankle and  foot were prepped and draped with DuraPrep and sterile drapes including a sterile stockinette.  A timeout was called and she was identified correct patient, correct left knee.  We then used Esmarch to wrap that leg and tourniquet was inflated to 300 mm  pressure.  We then made a direct midline incision over the patella and carried this proximally and distally.  We dissected the knee joint, carried out a medial parapatellar arthrotomy, finding a large joint effusion and significant loose body in the  superior patellar space.  There were large osteophytes around the patella, we removed.  With the knee in a flexed position, we removed remnants of ACL and medial and lateral meniscus as well as osteophytes in all three compartments.  We then used a  proximal tibia cutting guide for a 5-degree slope, correcting for varus and valgus and taking 10 mm off the high side.  We made this cut without difficulty.  After looking at the cut, though we backed it down 2 more millimeters.  We then used the  intramedullary drill and guide for making our distal femoral cut for a left knee at 5 degrees externally rotated.  We made this cut without difficulty and actually backed it down 2 more millimeters because she had a flexion contracture.  We then brought  the knee back down to full extension and removed further remnants of the medial and lateral meniscus from the  back of the knee and placed a 10 mm extension block and she had full extension.  We then went back to the femur and put our femoral sizing guide  based off the epicondylar axis.  Based off of this, we chose a size 6 left femur.  We put a 4-in-1 cutting block for a size 6 femur, made our anterior and posterior cuts, followed by our chamfer cuts.  Attention was then turned back to the tibia.  We  chose a size E left tibial tray for coverage off the tibial plateau and set the rotation off the femur and the tibial tubercle.  We made our keel punch and drill hole off of this.  With a size E left tibia, we trialed a size 6 left CR femur and a 10 mm  medial congruent fixed bearing polyethylene insert.  We were pleased with range of motion and stability with that insert.  We then made our patellar cut and drilled three holes for a size 32 patellar button.  With all trial instrumentation in the knee,  we put her through several cycles of motion.  We again were pleased with range of motion and stability.  We then removed all trial instrumentation from the knee and irrigated the knee with normal saline solution using pulsatile lavage.  We dried the knee  real well and mixed our cement.  We then cemented our real Biomet Zimmer size E left tibial tray followed by our size 6 left CR femur.  We placed our 10 mm medial congruent fixed bearing polyethylene insert and cemented our size 32 patellar button.  We  then held the knee fully extended and compressed while the cement hardened.  Once it hardened, we let the tourniquet down.  Hemostasis was obtained with electrocautery.  We closed the arthrotomy with interrupted #1 Vicryl suture followed by 0 Vicryl to  close the deep tissue and 2-0 Vicryl to close the subcutaneous tissue.  The skin was closed with staples.  A well-padded sterile dressing applied.  She was taken to recovery room in stable condition with all final counts being correct.  No complications  noted.  Of note,  Rexene Edison, PA-C, did assist during the entire case and his assistance was crucial for facilitating every aspect of this case.   SHW D: 03/16/2021 8:46:19 am T: 03/16/2021 9:58:00 am  JOB: [id number]/ [id number]

## 2021-03-16 NOTE — Evaluation (Signed)
Physical Therapy Evaluation Patient Details Name: Sophia Adams MRN: [id number] DOB: [birth year] Today's Date: 03/16/2021  History of Present Illness  65 y.o. female admitted 03/16/21 for L TKA. PMH includes R UKA 2016, HTN.  Clinical Impression  Pt is s/p TKA resulting in the deficits listed below (see PT Problem List). Pt ambulated 25' with RW and L KI, no loss of balance. Good progress expected.  Pt will benefit from skilled PT to increase their independence and safety with mobility to allow discharge to the venue listed below.         Recommendations for follow up therapy are one component of a multi-disciplinary discharge planning process, led by the attending physician.  Recommendations may be updated based on patient status, additional functional criteria and insurance authorization.  Follow Up Recommendations Follow physician's recommendations for discharge plan and follow up therapies    Assistance Recommended at Discharge Intermittent Supervision/Assistance  Functional Status Assessment Patient has had a recent decline in their functional status and demonstrates the ability to make significant improvements in function in a reasonable and predictable amount of time.  Equipment Recommendations  None recommended by PT    Recommendations for Other Services       Precautions / Restrictions Precautions Precautions: Knee Restrictions Weight Bearing Restrictions: No LLE Weight Bearing: Weight bearing as tolerated      Mobility  Bed Mobility Overal bed mobility: Modified Independent             General bed mobility comments: HOB up, used rail    Transfers Overall transfer level: Needs assistance Equipment used: Rolling walker (2 wheels) Transfers: Sit to/from Stand Sit to Stand: Min assist           General transfer comment: VCs hand placement    Ambulation/Gait Ambulation/Gait assistance: Min guard Gait Distance (Feet): 25 Feet Assistive device: Rolling  walker (2 wheels) Gait Pattern/deviations: Step-to pattern Gait velocity: decr     General Gait Details: VCs sequencing, KI on LLE 2* buckling in standing, distance limited by feeling lightheaded  Stairs            Wheelchair Mobility    Modified Rankin (Stroke Patients Only)       Balance Overall balance assessment: Modified Independent                                           Pertinent Vitals/Pain Pain Assessment: 0-10 Pain Score: 4  Pain Location: L knee Pain Descriptors / Indicators: Sore Pain Intervention(s): Limited activity within patient's tolerance;Monitored during session;Premedicated before session;Ice applied    Home Living Family/patient expects to be discharged to:: Private residence Living Arrangements: Spouse/significant other Available Help at Discharge: Family   Home Access: Stairs to enter Entrance Stairs-Rails: Right Entrance Stairs-Number of Steps: 4   Home Layout: One level Home Equipment: Agricultural consultant (2 wheels);Crutches      Prior Function Prior Level of Function : Independent/Modified Independent             Mobility Comments: denies falls in the past 6 months       Hand Dominance        Extremity/Trunk Assessment   Upper Extremity Assessment Upper Extremity Assessment: Overall WFL for tasks assessed    Lower Extremity Assessment Lower Extremity Assessment: LLE deficits/detail LLE Deficits / Details: 3/5 SLR but buckled in standing so applied KI LLE Sensation: WNL  Cervical / Trunk Assessment Cervical / Trunk Assessment: Normal  Communication   Communication: No difficulties  Cognition Arousal/Alertness: Awake/alert Behavior During Therapy: WFL for tasks assessed/performed Overall Cognitive Status: Within Functional Limits for tasks assessed                                          General Comments      Exercises Total Joint Exercises Ankle Circles/Pumps:  AROM;Both;10 reps;Supine   Assessment/Plan    PT Assessment Patient needs continued PT services  PT Problem List Decreased range of motion;Decreased activity tolerance;Decreased mobility;Pain;Decreased knowledge of use of DME       PT Treatment Interventions Gait training;Therapeutic activities;Therapeutic exercise;Stair training;Patient/family education    PT Goals (Current goals can be found in the Care Plan section)  Acute Rehab PT Goals Patient Stated Goal: walking without a limp, travel PT Goal Formulation: With patient/family Time For Goal Achievement: 03/23/21 Potential to Achieve Goals: Good    Frequency 7X/week   Barriers to discharge        Co-evaluation               AM-PAC PT "6 Clicks" Mobility  Outcome Measure Help needed turning from your back to your side while in a flat bed without using bedrails?: None Help needed moving from lying on your back to sitting on the side of a flat bed without using bedrails?: A Little Help needed moving to and from a bed to a chair (including a wheelchair)?: A Little Help needed standing up from a chair using your arms (e.g., wheelchair or bedside chair)?: A Little Help needed to walk in hospital room?: A Little Help needed climbing 3-5 steps with a railing? : A Lot 6 Click Score: 18    End of Session Equipment Utilized During Treatment: Gait belt;Left knee immobilizer Activity Tolerance: Patient tolerated treatment well Patient left: in chair;with call bell/phone within reach;with chair alarm set;with family/visitor present Nurse Communication: Mobility status PT Visit Diagnosis: Difficulty in walking, not elsewhere classified (R26.2);Pain Pain - Right/Left: Left Pain - part of body: Knee    Time: 4496-7591 PT Time Calculation (min) (ACUTE ONLY): 32 min   Charges:   PT Evaluation $PT Eval Moderate Complexity: 1 Mod PT Treatments $Gait Training: 8-22 mins        Ralene Bathe Kistler PT 03/16/2021   Acute Rehabilitation Services Pager [phone] Office [phone]

## 2021-03-16 NOTE — Transfer of Care (Signed)
Immediate Anesthesia Transfer of Care Note  Patient: Sophia Adams  Procedure(s) Performed: Procedure(s): LEFT TOTAL KNEE ARTHROPLASTY (Left)  Patient Location: PACU  Anesthesia Type:Spinal  Level of Consciousness:  sedated, patient cooperative and responds to stimulation  Airway & Oxygen Therapy:Patient Spontanous Breathing and Patient connected to face mask oxgen  Post-op Assessment:  Report given to PACU RN and Post -op Vital signs reviewed and stable  Post vital signs:  Reviewed and stable  Last Vitals:  Vitals:   03/16/21 0608  BP: (!) 144/93  Pulse: 86  Resp: 13  Temp: 36.5 C  SpO2: 99%    Complications: No apparent anesthesia complications

## 2021-03-16 NOTE — Anesthesia Postprocedure Evaluation (Signed)
Anesthesia Post Note  Patient: Sophia Adams  Procedure(s) Performed: LEFT TOTAL KNEE ARTHROPLASTY (Left: Knee)     Patient location during evaluation: PACU Anesthesia Type: Spinal Level of consciousness: awake Pain management: pain level controlled Vital Signs Assessment: post-procedure vital signs reviewed and stable Respiratory status: spontaneous breathing Cardiovascular status: stable Postop Assessment: no headache, no backache, spinal receding, patient able to bend at knees and no apparent nausea or vomiting Anesthetic complications: no   No notable events documented.  Last Vitals:  Vitals:   03/16/21 0945 03/16/21 1000  BP: 111/66 105/67  Pulse: 73 71  Resp: 17 13  Temp:  (!) 36.3 C  SpO2: 99% 100%    Last Pain:  Vitals:   03/16/21 1000  TempSrc:   PainSc: 0-No pain   Pain Goal: Patients Stated Pain Goal: 4 (03/16/21 0539)  LLE Motor Response: Purposeful movement (03/16/21 1000) LLE Sensation: Decreased, Numbness (03/16/21 1000) RLE Motor Response: Purposeful movement (03/16/21 1000) RLE Sensation: Decreased, Numbness (03/16/21 1000) L Sensory Level: L3-Anterior knee, lower leg (03/16/21 1000) R Sensory Level: S1-Sole of foot, small toes (03/16/21 1000) Epidural/Spinal Function Patient able to flex knees: Yes (03/16/21 1000), Patient able to lift hips off bed: No (03/16/21 1000), Back pain beyond tenderness at insertion site: No (03/16/21 1000), Progressively worsening motor and/or sensory loss: No (03/16/21 1000), Bowel and/or bladder incontinence post epidural: No (03/16/21 1000)  Caren Macadam

## 2021-03-17 DIAGNOSIS — M1712 Unilateral primary osteoarthritis, left knee: Secondary | ICD-10-CM | POA: Diagnosis not present

## 2021-03-17 LAB — BASIC METABOLIC PANEL
Anion gap: 3 — ABNORMAL LOW (ref 5–15)
BUN: 13 mg/dL (ref 8–23)
CO2: 25 mmol/L (ref 22–32)
Calcium: 8.4 mg/dL — ABNORMAL LOW (ref 8.9–10.3)
Chloride: 106 mmol/L (ref 98–111)
Creatinine, Ser: 0.7 mg/dL (ref 0.44–1.00)
GFR, Estimated: >60 mL/min (ref >60)
Glucose, Bld: 182 mg/dL — ABNORMAL HIGH (ref 70–99)
Potassium: 4.1 mmol/L (ref 3.5–5.1)
Sodium: 134 mmol/L — ABNORMAL LOW (ref 135–145)

## 2021-03-17 LAB — CBC
HCT: 28 % — ABNORMAL LOW (ref 36.0–46.0)
Hemoglobin: 8.9 g/dL — ABNORMAL LOW (ref 12.0–15.0)
MCH: 27.1 pg (ref 26.0–34.0)
MCHC: 31.8 g/dL (ref 30.0–36.0)
MCV: 85.1 fL (ref 80.0–100.0)
Platelets: 256 10*3/uL (ref 150–400)
RBC: 3.29 MIL/uL — ABNORMAL LOW (ref 3.87–5.11)
RDW: 13.1 % (ref 11.5–15.5)
WBC: 14.4 10*3/uL — ABNORMAL HIGH (ref 4.0–10.5)
nRBC: 0 % (ref 0.0–0.2)

## 2021-03-17 MED ORDER — METHOCARBAMOL 500 MG PO TABS: 500.0000 mg | ORAL_TABLET | Freq: Four times a day (QID) | ORAL | 1 refills | Status: DC | PRN

## 2021-03-17 MED ORDER — OXYCODONE HCL 5 MG PO TABS: 5.0000 mg | ORAL_TABLET | ORAL | 0 refills | Status: DC | PRN

## 2021-03-17 MED ORDER — ASPIRIN 81 MG PO CHEW: 81.0000 mg | CHEWABLE_TABLET | Freq: Two times a day (BID) | ORAL | 0 refills | Status: DC

## 2021-03-17 NOTE — Progress Notes (Signed)
Physical Therapy Treatment Patient Details Name: Sophia Adams MRN: [id number] DOB: 1955/07/01 Today's Date: 03/17/2021   History of Present Illness 65 y.o. female admitted 03/16/21 for L TKA. PMH includes R UKA 2016, HTN.    PT Comments    Progressing well with mobility. Reviewed/practiced exercises, gait training, and stair negotiation. All education completed.  No further questions/concerns from pt/husband.   Recommendations for follow up therapy are one component of a multi-disciplinary discharge planning process, led by the attending physician.  Recommendations may be updated based on patient status, additional functional criteria and insurance authorization.  Follow Up Recommendations  Follow physician's recommendations for discharge plan and follow up therapies     Assistance Recommended at Discharge Intermittent Supervision/Assistance  Equipment Recommendations  None recommended by PT    Recommendations for Other Services       Precautions / Restrictions Precautions Precautions: Knee Restrictions Weight Bearing Restrictions: No LLE Weight Bearing: Weight bearing as tolerated     Mobility  Bed Mobility Overal bed mobility: Modified Independent                  Transfers Overall transfer level: Needs assistance Equipment used: Rolling walker (2 wheels) Transfers: Sit to/from Stand Sit to Stand: Supervision           General transfer comment: VCs safety, technique, sequence.    Ambulation/Gait Ambulation/Gait assistance: Supervision Gait Distance (Feet): 65 Feet Assistive device: Rolling walker (2 wheels) Gait Pattern/deviations: Step-to pattern       General Gait Details: Supv for safety. Cues for safety, sequence. No buckling of L LE today.   Stairs Stairs: Yes Stairs assistance: Min assist Stair Management: Forwards;One rail Right;Step to pattern Number of Stairs: 2 General stair comments: up and over portabld stairs. 1 rail R, 1 HHA  L. cues for safety, sequence, technique. husband present to observe.   Wheelchair Mobility    Modified Rankin (Stroke Patients Only)       Balance Overall balance assessment: Needs assistance         Standing balance support: Bilateral upper extremity supported;Reliant on assistive device for balance Standing balance-Leahy Scale: Fair                              Cognition Arousal/Alertness: Awake/alert Behavior During Therapy: WFL for tasks assessed/performed Overall Cognitive Status: Within Functional Limits for tasks assessed                                          Exercises Total Joint Exercises Ankle Circles/Pumps: AROM;Both;10 reps Quad Sets: AROM;Both;10 reps Straight Leg Raises: AROM;Left;10 reps Knee Flexion: AROM;Left;10 reps;Seated Goniometric ROM: ~10-90 degrees    General Comments        Pertinent Vitals/Pain Pain Assessment: 0-10 Pain Score: 5  Pain Location: L knee Pain Descriptors / Indicators: Discomfort;Sore Pain Intervention(s): Limited activity within patient's tolerance;Monitored during session;Ice applied;Repositioned    Home Living                          Prior Function            PT Goals (current goals can now be found in the care plan section) Progress towards PT goals: Progressing toward goals    Frequency    7X/week      PT Plan Current  plan remains appropriate    Co-evaluation              AM-PAC PT "6 Clicks" Mobility   Outcome Measure  Help needed turning from your back to your side while in a flat bed without using bedrails?: None Help needed moving from lying on your back to sitting on the side of a flat bed without using bedrails?: None Help needed moving to and from a bed to a chair (including a wheelchair)?: A Little Help needed standing up from a chair using your arms (e.g., wheelchair or bedside chair)?: A Little Help needed to walk in hospital room?: A  Little Help needed climbing 3-5 steps with a railing? : A Little 6 Click Score: 20    End of Session Equipment Utilized During Treatment: Gait belt Activity Tolerance: Patient tolerated treatment well Patient left: in bed;with call bell/phone within reach;with family/visitor present   PT Visit Diagnosis: Other abnormalities of gait and mobility (R26.89);Pain Pain - Right/Left: Left Pain - part of body: Knee     Time: 3546-5681 PT Time Calculation (min) (ACUTE ONLY): 35 min  Charges:  $Gait Training: 8-22 mins $Therapeutic Exercise: 8-22 mins                         Faye Ramsay, PT Acute Rehabilitation  Office: [phone] Pager: [phone]

## 2021-03-17 NOTE — Progress Notes (Addendum)
Subjective: 1 Day Post-Op Procedure(s) (LRB): LEFT TOTAL KNEE ARTHROPLASTY (Left) Patient reports pain as moderate.  No complaints.   Objective: Vital signs in last 24 hours: Temp:  [97.4 F (36.3 C)-97.8 F (36.6 C)] 97.7 F (36.5 C) (12/03 0535) Pulse Rate:  [67-85] 71 (12/03 0535) Resp:  [12-19] 16 (12/03 0535) BP: (105-122)/(62-70) 109/62 (12/03 0535) SpO2:  [97 %-100 %] 99 % (12/03 0535)  Intake/Output from previous day: 12/02 0701 - 12/03 0700 In: 2615 [P.O.:240; I.V.:2375] Out: 2375 [Urine:2325; Blood:50] Intake/Output this shift: No intake/output data recorded.  No results for input(s): HGB in the last 72 hours. No results for input(s): WBC, RBC, HCT, PLT in the last 72 hours. Recent Labs    03/17/21 0342  NA 134*  K 4.1  CL 106  CO2 25  BUN 13  CREATININE 0.70  GLUCOSE 182*  CALCIUM 8.4*   No results for input(s): LABPT, INR in the last 72 hours.  Sensation intact distally Dorsiflexion/Plantar flexion intact Incision: no drainage Compartment soft   Assessment/Plan: 1 Day Post-Op Procedure(s) (LRB): LEFT TOTAL KNEE ARTHROPLASTY (Left) Up with therapy Discharge home with home health CBC pending    T[place]ania Basham 03/17/2021, 9:11 AM

## 2021-03-17 NOTE — Plan of Care (Signed)
  Problem: Education: Goal: Knowledge of the prescribed therapeutic regimen will improve Outcome: Adequate for Discharge Goal: Individualized Educational Video(s) Outcome: Adequate for Discharge   Problem: Activity: Goal: Ability to avoid complications of mobility impairment will improve Outcome: Adequate for Discharge Goal: Range of joint motion will improve Outcome: Adequate for Discharge   Problem: Clinical Measurements: Goal: Postoperative complications will be avoided or minimized Outcome: Adequate for Discharge   Problem: Pain Management: Goal: Pain level will decrease with appropriate interventions Outcome: Adequate for Discharge   Problem: Skin Integrity: Goal: Will show signs of wound healing Outcome: Adequate for Discharge   Problem: Education: Goal: Knowledge of General Education information will improve Description: Including pain rating scale, medication(s)/side effects and non-pharmacologic comfort measures Outcome: Adequate for Discharge   Problem: Health Behavior/Discharge Planning: Goal: Ability to manage health-related needs will improve Outcome: Adequate for Discharge   Problem: Clinical Measurements: Goal: Ability to maintain clinical measurements within normal limits will improve Outcome: Adequate for Discharge Goal: Will remain free from infection Outcome: Adequate for Discharge Goal: Diagnostic test results will improve Outcome: Adequate for Discharge Goal: Respiratory complications will improve Outcome: Adequate for Discharge Goal: Cardiovascular complication will be avoided Outcome: Adequate for Discharge   Problem: Activity: Goal: Risk for activity intolerance will decrease Outcome: Adequate for Discharge   Problem: Nutrition: Goal: Adequate nutrition will be maintained Outcome: Adequate for Discharge   Problem: Coping: Goal: Level of anxiety will decrease Outcome: Adequate for Discharge   Problem: Elimination: Goal: Will not  experience complications related to bowel motility Outcome: Adequate for Discharge Goal: Will not experience complications related to urinary retention Outcome: Adequate for Discharge   Problem: Pain Managment: Goal: General experience of comfort will improve Outcome: Adequate for Discharge   Problem: Safety: Goal: Ability to remain free from injury will improve Outcome: Adequate for Discharge   Problem: Skin Integrity: Goal: Risk for impaired skin integrity will decrease Outcome: Adequate for Discharge   Problem: Acute Rehab PT Goals(only PT should resolve) Goal: Pt Will Go Supine/Side To Sit Outcome: Adequate for Discharge Goal: Patient Will Transfer Sit To/From Stand Outcome: Adequate for Discharge Goal: Pt Will Ambulate Outcome: Adequate for Discharge Goal: Pt/caregiver will Perform Home Exercise Program Outcome: Adequate for Discharge

## 2021-03-17 NOTE — Plan of Care (Signed)
  Problem: Pain Managment: Goal: General experience of comfort will improve Outcome: Progressing   Problem: Safety: Goal: Ability to remain free from injury will improve Outcome: Progressing   Problem: Coping: Goal: Level of anxiety will decrease Outcome: Progressing   

## 2021-03-17 NOTE — Discharge Instructions (Signed)

## 2021-03-17 NOTE — TOC Transition Note (Signed)
Transition of Care [facility]) - CM/SW Discharge Note   Patient Details  Name: Mara Favero MRN: [id number] Date of Birth: [birth year]  Transition of Care [facility]) CM/SW Contact:  Darleene Cleaver, LCSW Phone Number: 03/17/2021, 10:36 AM   Clinical Narrative:     Patient states that's she does not need any equipment from home.  Home health has been prearranged for her, patient will be going home with home health PT through [facility].  CSW signing off please reconsult with any other social work needs, home health agency has been notified of planned discharge.     Final next level of care: Home w Home Health Services Barriers to Discharge: Barriers Resolved   Patient Goals and CMS Choice Patient states their goals for this hospitalization and ongoing recovery are:: To return back home with home health. CMS Medicare.gov Compare Post Acute Care list provided to:: Patient Choice offered to / list presented to : Patient  Discharge Placement                       Discharge Plan and Services                          HH Arranged: PT [facility] Agency: [facility] Date [facility] Agency Contacted: 03/17/21 Time HH Agency Contacted: [phone]    Social Determinants of Health (SDOH) Interventions     Readmission Risk Interventions No flowsheet data found.

## 2021-03-17 NOTE — Discharge Summary (Signed)
Patient ID: Sophia Adams MRN: [id number] DOB/AGE: May 13, 1955 65 y.o.  Admit date: 03/16/2021 Discharge date: 03/17/2021  Admission Diagnoses:  Principal Problem:   Unilateral primary osteoarthritis, left knee Active Problems:   Status post left knee replacement   Discharge Diagnoses:  Same  Past Medical History:  Diagnosis Date   Anemia 21 yrs ago   Arthritis    oa   Family history of adverse reaction to anesthesia    both parents get ponv   Hypertension    Hypothyroidism    Pre-diabetes     Surgeries: Procedure(s): LEFT TOTAL KNEE ARTHROPLASTY on 03/16/2021   Consultants:   Discharged Condition: Improved  Hospital Course: Sophia Adams is an 65 y.o. female who was admitted 03/16/2021 for operative treatment ofUnilateral primary osteoarthritis, left knee. Patient has severe unremitting pain that affects sleep, daily activities, and work/hobbies. After pre-op clearance the patient was taken to the operating room on 03/16/2021 and underwent  Procedure(s): LEFT TOTAL KNEE ARTHROPLASTY.    Patient was given perioperative antibiotics:  Anti-infectives (From admission, onward)    Start     Dose/Rate Route Frequency Ordered Stop   03/16/21 1300  ceFAZolin (ANCEF) IVPB 1 g/50 mL premix        1 g 100 mL/hr over 30 Minutes Intravenous Every 6 hours 03/16/21 1121 03/16/21 1951   03/16/21 0600  ceFAZolin (ANCEF) IVPB 2g/100 mL premix        2 g 200 mL/hr over 30 Minutes Intravenous On call to O.R. 03/16/21 2993 03/16/21 7169        Patient was given sequential compression devices, early ambulation, and chemoprophylaxis to prevent DVT.  Patient benefited maximally from hospital stay and there were no complications.    Recent vital signs: Patient Vitals for the past 24 hrs:  BP Temp Temp src Pulse Resp SpO2  03/17/21 0915 (!) 105/56 97.7 F (36.5 C) -- 79 17 98 %  03/17/21 0535 109/62 97.7 F (36.5 C) Oral 71 16 99 %  03/17/21 0138 122/62 97.7 F (36.5 C) Oral 68 16  97 %  03/16/21 2157 117/64 97.7 F (36.5 C) Oral 73 16 97 %  03/16/21 1332 115/62 97.8 F (36.6 C) -- 85 18 98 %  03/16/21 1123 117/68 -- -- 69 18 98 %  03/16/21 1100 117/70 (!) 97.4 F (36.3 C) -- 67 14 100 %  03/16/21 1000 105/67 (!) 97.4 F (36.3 C) -- 71 13 100 %  03/16/21 0945 111/66 -- -- 73 17 99 %     Recent laboratory studies:  Recent Labs    03/17/21 0342 03/17/21 0818  WBC  --  14.4*  HGB  --  8.9*  HCT  --  28.0*  PLT  --  256  NA 134*  --   K 4.1  --   CL 106  --   CO2 25  --   BUN 13  --   CREATININE 0.70  --   GLUCOSE 182*  --   CALCIUM 8.4*  --      Discharge Medications:   Allergies as of 03/17/2021   No Known Allergies      Medication List     TAKE these medications    aspirin 81 MG chewable tablet Chew 1 tablet (81 mg total) by mouth 2 (two) times daily.   enalapril 5 MG tablet Commonly known as: VASOTEC Take 7.5 mg by mouth daily.   methocarbamol 500 MG tablet Commonly known as: ROBAXIN Take 1 tablet (  500 mg total) by mouth every 6 (six) hours as needed for muscle spasms.   oxyCODONE 5 MG immediate release tablet Commonly known as: Oxy IR/ROXICODONE Take 1-2 tablets (5-10 mg total) by mouth every 4 (four) hours as needed for moderate pain (pain score 4-6).   rosuvastatin 5 MG tablet Commonly known as: CRESTOR Take 5 mg by mouth at bedtime.   Synthroid 88 MCG tablet Generic drug: levothyroxine Take 88 mcg by mouth daily before breakfast.               Durable Medical Equipment  (From admission, onward)           Start     Ordered   03/16/21 1122  DME 3 n 1  Once        03/16/21 1121   03/16/21 1122  DME Walker rolling  Once       Question Answer Comment  Walker: With 5 Inch Wheels   Patient needs a walker to treat with the following condition Status post left knee replacement      03/16/21 1121            Diagnostic Studies: DG Knee Left Port  Result Date: 03/16/2021 CLINICAL DATA:  Knee surgery  EXAM: PORTABLE LEFT KNEE - 1-2 VIEW COMPARISON:  None. FINDINGS: LEFT total knee arthroplasty. Prosthetic components appear well seated. Expected postsurgical soft tissue changes in the anterior knee. IMPRESSION: No complication following total knee arthroplasty Electronically Signed   By: Genevive Bi M.D.   On: 03/16/2021 10:07    Disposition: Discharge disposition: 06-Home-Health Care Svc          Follow-up Information     Kathryne Hitch, MD Follow up in 2 week(s).   Specialty: Orthopedic Surgery Contact information: [address] [place] [address] [phone]                  Signed: Richardean Canal 03/17/2021, 9:38 AM

## 2021-03-20 ENCOUNTER — Encounter (HOSPITAL_COMMUNITY): Payer: Self-pay | Admitting: Orthopaedic Surgery

## 2021-03-29 ENCOUNTER — Ambulatory Visit (INDEPENDENT_AMBULATORY_CARE_PROVIDER_SITE_OTHER): Payer: BC Managed Care – PPO | Admitting: Orthopaedic Surgery

## 2021-03-29 ENCOUNTER — Other Ambulatory Visit: Payer: Self-pay

## 2021-03-29 ENCOUNTER — Encounter: Payer: Self-pay | Admitting: Orthopaedic Surgery

## 2021-03-29 DIAGNOSIS — Z96652 Presence of left artificial knee joint: Secondary | ICD-10-CM

## 2021-03-29 NOTE — Progress Notes (Signed)
The patient is a 65 year old female well-known to me.  She is 2 weeks tomorrow status post a left total knee arthroplasty.  She is only taking Robaxin and Tylenol.  She has been having home therapy and they report good motion of her knee with almost full extension to about 95 degrees or more flexion.  Her left knee incision looks good.  Remove the staples in place Steri-Strips.  Her calf is soft and there is no significant foot and ankle swelling.  She will continue once a day aspirin for a week and then can stop her aspirin.  I gave her prescription for outpatient physical therapy to start soon.  Since she is not taking narcotic she can drop from my standpoint.  They will help transition her from a walker to a cane to hopefully nothing over the next few weeks.  We will see her back in a month to see how she is doing overall but no x-rays are needed.  All questions and concerns were answered and addressed.

## 2021-04-26 ENCOUNTER — Encounter: Payer: BC Managed Care – PPO | Admitting: Orthopaedic Surgery

## 2021-04-30 ENCOUNTER — Other Ambulatory Visit: Payer: Self-pay

## 2021-04-30 ENCOUNTER — Encounter: Payer: Self-pay | Admitting: Orthopaedic Surgery

## 2021-04-30 ENCOUNTER — Ambulatory Visit (INDEPENDENT_AMBULATORY_CARE_PROVIDER_SITE_OTHER): Payer: BC Managed Care – PPO | Admitting: Orthopaedic Surgery

## 2021-04-30 DIAGNOSIS — Z96652 Presence of left artificial knee joint: Secondary | ICD-10-CM

## 2021-04-30 NOTE — Progress Notes (Signed)
The patient is now 6 weeks status post a left total knee arthroplasty.  She does report increased motion and strength.  She is only taking ibuprofen or Tylenol.  She says the worst time is at night getting rest and she really gets good rest.  Physical therapy has been amazed with her mobility as am I.  Her extension is full today of her left knee and her flexion is to past 115 degrees.  The knee feels ligamentously stable.  There is swelling to be expected.  Her calf is soft.  She is wearing compressive hose.  She will continue to increase her activities as comfort allows.  I recommended some stronger pain medicine at night but she says she really does not need that.  We will see her back in 4 weeks which will be 1 week before her return to work estimate of February 20.  No x-rays are needed at the next visit.

## 2021-05-28 ENCOUNTER — Other Ambulatory Visit: Payer: Self-pay

## 2021-05-28 ENCOUNTER — Ambulatory Visit (INDEPENDENT_AMBULATORY_CARE_PROVIDER_SITE_OTHER): Payer: BC Managed Care – PPO | Admitting: Orthopaedic Surgery

## 2021-05-28 ENCOUNTER — Encounter: Payer: Self-pay | Admitting: Orthopaedic Surgery

## 2021-05-28 DIAGNOSIS — Z96652 Presence of left artificial knee joint: Secondary | ICD-10-CM

## 2021-05-28 NOTE — Progress Notes (Signed)
The patient is about 10 weeks status post a left knee replacement.  She is doing well overall and has completed physical therapy.  She says her range of motion is 0 degrees to 120 degrees in the left knee is doing well for her.  It feels ligamentously stable on my exam with excellent range of motion of her left knee.  We talked about a return to work date being June 04, 2020.  She can return without restrictions and I filled out paperwork for her to do so.  She has really made great progress.  I do not need to see her back for 6 months.  At that visit I will have an AP and lateral of her left operative knee.

## 2021-06-18 ENCOUNTER — Telehealth: Payer: Self-pay | Admitting: Orthopaedic Surgery

## 2021-06-18 NOTE — Telephone Encounter (Signed)
Received $25.00 check,medical records release form and disability paperwork from patient/Forwarding to [employer] today ?

## 2021-07-30 ENCOUNTER — Telehealth: Payer: Self-pay | Admitting: Orthopaedic Surgery

## 2021-07-30 NOTE — Telephone Encounter (Signed)
#   is Fax not phone  ?Tammy Sours is DDS ?

## 2021-07-30 NOTE — Telephone Encounter (Signed)
She had one in December 2022.  Probably means abx for dental work. ?

## 2021-07-30 NOTE — Telephone Encounter (Signed)
Sophia Adams  ?[phone]  ?Wants to know if any pre treatment is needed before total knee surgery ?

## 2021-07-31 NOTE — Telephone Encounter (Signed)
Faxed to provided number  

## 2021-10-22 ENCOUNTER — Other Ambulatory Visit: Payer: Self-pay | Admitting: Family Medicine

## 2021-10-22 DIAGNOSIS — Z1231 Encounter for screening mammogram for malignant neoplasm of breast: Secondary | ICD-10-CM

## 2021-11-02 ENCOUNTER — Ambulatory Visit
Admission: RE | Admit: 2021-11-02 | Discharge: 2021-11-02 | Disposition: A | Discharge status: Home / Self Care | Payer: BC Managed Care – PPO | Source: Ambulatory Visit

## 2021-11-02 DIAGNOSIS — Z1231 Encounter for screening mammogram for malignant neoplasm of breast: Secondary | ICD-10-CM

## 2021-11-19 ENCOUNTER — Ambulatory Visit: Payer: BC Managed Care – PPO | Admitting: Physician Assistant

## 2021-11-28 ENCOUNTER — Ambulatory Visit: Payer: BC Managed Care – PPO | Admitting: Orthopaedic Surgery

## 2021-12-03 ENCOUNTER — Encounter: Payer: Self-pay | Admitting: Orthopaedic Surgery

## 2021-12-03 ENCOUNTER — Ambulatory Visit: Payer: BC Managed Care – PPO | Admitting: Orthopaedic Surgery

## 2021-12-03 ENCOUNTER — Ambulatory Visit (INDEPENDENT_AMBULATORY_CARE_PROVIDER_SITE_OTHER): Payer: BC Managed Care – PPO

## 2021-12-03 DIAGNOSIS — Z96652 Presence of left artificial knee joint: Secondary | ICD-10-CM | POA: Diagnosis not present

## 2021-12-03 NOTE — Progress Notes (Signed)
Mrs. Sophia Adams is a 66 year old female who is now almost 8 months out from a total knee arthroplasty of her left knee.  She says she has good range of motion and strength and is doing great.  She has been anemic since surgery and sees a hematologist coming up.  She feels just slightly fatigued.  She is retired from her job Agricultural consultant.  Her left knee has excellent range of motion and is ligamentously stable.  He looks great overall.  She does have a history of a partial knee arthroplasty on the right knee which is so far stable.  An AP and lateral left knee standing x-rays there is an AP view of both knees.  Both knees show well-seated implants on the AP view and lateral view of the left knee shows a well-seated total knee arthroplasty.  She is doing so well that from an orthopedic standpoint she can follow-up as needed.  I wished her luck with her upcoming hematology visit.  If there is any issues with her knee she knows to let us know.

## 2021-12-05 NOTE — Progress Notes (Signed)
[facility]  [address] [place],  [place]  [address] [phone]  Clinic Day:  12/06/2021  Referring physician: Ailene Ravel, MD   HISTORY OF PRESENT ILLNESS:  The patient is a 66 y.o. female  who I was asked to consult upon for iron deficiency anemia.  Recent labs showed a low hemoglobin of 8.2, with a low MCV of 72.  Iron studies done recently showed a low ferritin of 12, a low serum iron of 19, a TIBC of 429, and a low iron saturation of 4%.  The patient denies having any overt forms of blood loss to explain her iron deficiency anemia.  She recalls having a colonoscopy 2-1/2 years ago, for which 3 polyps were removed.  She has been taking 1 iron pill daily since at least December 2022.  The patient claims that her maternal has issues with chronic anemia.  She denies having any pica-like symptoms which ever alerted her to be iron deficient.  PAST MEDICAL HISTORY:   Past Medical History:  Diagnosis Date   Anemia 21 yrs ago   Arthritis    oa   Family history of adverse reaction to anesthesia    both parents get ponv   Hyperlipidemia    Hypertension    Hypothyroidism    Pre-diabetes     PAST SURGICAL HISTORY:   Past Surgical History:  Procedure Laterality Date   ADENOIDECTOMY     BONE EXOSTOSIS EXCISION Right 03/31/2015   Procedure: EXOSTOSIS EXCISION RIGHT FOOT;  Surgeon: Kathryne Hitch, MD;  Location: [facility];  Service: Orthopedics;  Laterality: Right;   BREAST BIOPSY Left 1990 and 1986   benign   BREAST EXCISIONAL BIOPSY     PARTIAL KNEE ARTHROPLASTY Right 03/31/2015   Procedure: RIGHT UNICOMPARTMENTAL KNEE ARTHROPLASTY;  Surgeon: Kathryne Hitch, MD;  Location: [facility];  Service: Orthopedics;  Laterality: Right;   POLYPECTOMY     right knee arthroscopy  03/2014   TONSILLECTOMY  1965   TOTAL KNEE ARTHROPLASTY Left 03/16/2021   Procedure: LEFT TOTAL KNEE ARTHROPLASTY;  Surgeon: Kathryne Hitch, MD;  Location:  [facility];  Service: Orthopedics;  Laterality: Left;   TUBAL LIGATION  1995   WISDOM TOOTH EXTRACTION      CURRENT MEDICATIONS:   Current Outpatient Medications  Medication Sig Dispense Refill   aspirin 81 MG chewable tablet Chew 1 tablet (81 mg total) by mouth 2 (two) times daily. 35 tablet 0   enalapril (VASOTEC) 5 MG tablet Take 7.5 mg by mouth daily.     methocarbamol (ROBAXIN) 500 MG tablet Take 1 tablet (500 mg total) by mouth every 6 (six) hours as needed for muscle spasms. 40 tablet 1   oxyCODONE (OXY IR/ROXICODONE) 5 MG immediate release tablet Take 1-2 tablets (5-10 mg total) by mouth every 4 (four) hours as needed for moderate pain (pain score 4-6). 30 tablet 0   rosuvastatin (CRESTOR) 5 MG tablet Take 5 mg by mouth at bedtime.     SYNTHROID 88 MCG tablet Take 88 mcg by mouth daily before breakfast.     No current facility-administered medications for this visit.    ALLERGIES:  No Known Allergies  FAMILY HISTORY:   Family History  Problem Relation Age of Onset   Cerebral aneurysm Mother    Hypertension Mother    Hyperlipidemia Mother    Diabetes Mother    Breast cancer Neg Hx     SOCIAL HISTORY:  The patient was born and  raised in [place] [facility] [place]  She lives in [place] with her husband of 41 years.  They have 3 children and 3 grandchildren.  She is a retired Runner, broadcasting/film/video.  There is no history of alcoholism or tobacco abuse.  REVIEW OF SYSTEMS:  Review of Systems  Constitutional:  Negative for fatigue and fever.  HENT:   Negative for hearing loss and sore throat.   Eyes:  Negative for eye problems.  Respiratory:  Negative for chest tightness, cough and hemoptysis.   Cardiovascular:  Negative for chest pain and palpitations.  Gastrointestinal:  Positive for diarrhea. Negative for abdominal distention, abdominal pain, blood in stool, constipation, nausea and vomiting.  Endocrine: Negative for hot flashes.  Genitourinary:  Negative for difficulty urinating, dysuria,  frequency, hematuria and nocturia.   Musculoskeletal:  Positive for arthralgias. Negative for back pain, gait problem and myalgias.  Skin: Negative.  Negative for itching and rash.  Neurological: Negative.  Negative for dizziness, extremity weakness, gait problem, headaches, light-headedness and numbness.  Hematological: Negative.   Psychiatric/Behavioral: Negative.  Negative for depression and suicidal ideas. The patient is not nervous/anxious.    PHYSICAL EXAM:  Blood pressure 137/75, pulse 89, temperature 98.6 F (37 C), resp. rate 16, height 5' 4.5" (1.638 m), weight 204 lb 12.8 oz (92.9 kg), SpO2 95 %. Wt Readings from Last 3 Encounters:  12/06/21 204 lb 12.8 oz (92.9 kg)  03/16/21 203 lb 6.4 oz (92.3 kg)  03/07/21 203 lb 6.4 oz (92.3 kg)   Body mass index is 34.61 kg/m. Performance status (ECOG): 0 - Asymptomatic Physical Exam Constitutional:      Appearance: Normal appearance. She is not ill-appearing.  HENT:     Mouth/Throat:     Mouth: Mucous membranes are moist.     Pharynx: Oropharynx is clear. No oropharyngeal exudate or posterior oropharyngeal erythema.  Cardiovascular:     Rate and Rhythm: Normal rate and regular rhythm.     Heart sounds: No murmur heard.    No friction rub. No gallop.  Pulmonary:     Effort: Pulmonary effort is normal. No respiratory distress.     Breath sounds: Normal breath sounds. No wheezing, rhonchi or rales.  Abdominal:     General: Bowel sounds are normal. There is no distension.     Palpations: Abdomen is soft. There is no mass.     Tenderness: There is no abdominal tenderness.  Musculoskeletal:        General: No swelling.     Right lower leg: No edema.     Left lower leg: No edema.  Lymphadenopathy:     Cervical: No cervical adenopathy.     Upper Body:     Right upper body: No supraclavicular or axillary adenopathy.     Left upper body: No supraclavicular or axillary adenopathy.     Lower Body: No right inguinal adenopathy. No  left inguinal adenopathy.  Skin:    General: Skin is warm.     Coloration: Skin is not jaundiced.     Findings: No lesion or rash.  Neurological:     General: No focal deficit present.     Mental Status: She is alert and oriented to person, place, and time. Mental status is at baseline.  Psychiatric:        Mood and Affect: Mood normal.        Behavior: Behavior normal.        Thought Content: Thought content normal.     LABS:  Latest Ref Rng & Units 12/06/2021   12:00 AM 03/17/2021    8:18 AM 03/07/2021    1:26 PM  CBC  WBC  6.3     14.4  6.4   Hemoglobin 12.0 - 16.0 9.4     8.9  10.3   Hematocrit 36 - 46 29     28.0  32.4   Platelets 150 - 400 K/uL 304     256  292      This result is from an external source.      Latest Ref Rng & Units 12/06/2021   12:00 AM 03/17/2021    3:42 AM 03/07/2021    1:26 PM  CMP  Glucose 70 - 99 mg/dL  170  017   BUN 4 - 21 19     13  17    Creatinine 0.5 - 1.1 0.8     0.70  0.67   Sodium 137 - 147 138     134  138   Potassium 3.5 - 5.1 mEq/L 3.8     4.1  4.0   Chloride 99 - 108 104     106  106   CO2 13 - 22 25     25  25    Calcium 8.7 - 10.7 9.4     8.4  9.1   Alkaline Phos 25 - 125 59        AST 13 - 35 29        ALT 7 - 35 U/L 15           This result is from an external source.   ASSESSMENT & PLAN:  A 66 y.o. female who I was asked to consult upon for iron deficiency anemia.  I will arrange for her to receive IV iron over these next few weeks to rapidly replenish her iron stores and normalize her hemoglobin.  Although she had a colonoscopy 2+ years ago, the patient may need another GI evaluation when considering her low iron levels.  I recommended she speak with her GI physician about considering a Cologuard test or having a repeat colonoscopy.  Otherwise, I will see her back in 3 months to reassess her iron and hemoglobin levels to see how well she responded to her upcoming IV iron.  The patient understands all the plans discussed  today and is in agreement with them.  I do appreciate Hamrick, , MD for his new consult.   Lakeithia Rasor 79, MD

## 2021-12-06 ENCOUNTER — Encounter: Payer: Self-pay | Admitting: Oncology

## 2021-12-06 ENCOUNTER — Other Ambulatory Visit: Payer: Self-pay | Admitting: Oncology

## 2021-12-06 ENCOUNTER — Inpatient Hospital Stay: Payer: BC Managed Care – PPO | Attending: Oncology | Admitting: Oncology

## 2021-12-06 ENCOUNTER — Inpatient Hospital Stay: Payer: BC Managed Care – PPO

## 2021-12-06 DIAGNOSIS — D509 Iron deficiency anemia, unspecified: Secondary | ICD-10-CM | POA: Insufficient documentation

## 2021-12-06 DIAGNOSIS — D508 Other iron deficiency anemias: Secondary | ICD-10-CM | POA: Diagnosis not present

## 2021-12-06 LAB — COMPREHENSIVE METABOLIC PANEL
Albumin: 4.5 (ref 3.5–5.0)
Calcium: 9.4 (ref 8.7–10.7)

## 2021-12-06 LAB — BASIC METABOLIC PANEL
BUN: 19 (ref 4–21)
CO2: 25 — AB (ref 13–22)
Chloride: 104 (ref 99–108)
Creatinine: 0.8 (ref 0.5–1.1)
Glucose: 145
Potassium: 3.8 mEq/L (ref 3.5–5.1)
Sodium: 138 (ref 137–147)

## 2021-12-06 LAB — CBC AND DIFFERENTIAL
HCT: 29 — AB (ref 36–46)
Hemoglobin: 9.4 — AB (ref 12.0–16.0)
Neutrophils Absolute: 3.59
Platelets: 304 10*3/uL (ref 150–400)
WBC: 6.3

## 2021-12-06 LAB — HEPATIC FUNCTION PANEL
ALT: 15 U/L (ref 7–35)
AST: 29 (ref 13–35)
Alkaline Phosphatase: 59 (ref 25–125)
Bilirubin, Total: 0.8

## 2021-12-06 LAB — IRON AND TIBC
Iron: 97 ug/dL (ref 28–170)
Saturation Ratios: 20 % (ref 10.4–31.8)
TIBC: 479 ug/dL — ABNORMAL HIGH (ref 250–450)
UIBC: 382 ug/dL

## 2021-12-06 LAB — CBC: RBC: 4.18 (ref 3.87–5.11)

## 2021-12-06 LAB — FERRITIN: Ferritin: 10 ng/mL — ABNORMAL LOW (ref 11–307)

## 2021-12-06 NOTE — Progress Notes (Signed)
..  Feraheme orders changed to venofer due to insurance plan preference.  Message to scheduling has been sent.  

## 2021-12-07 ENCOUNTER — Telehealth: Payer: Self-pay | Admitting: Oncology

## 2021-12-07 NOTE — Telephone Encounter (Signed)
Contacted pt to schedule follow up per 12/06/21 los and IV Iron. Unable to reach via phone, vm was left requesting pt contact the office back.  Scheduling Message Entered by Domenic Schwab on 12/06/2021 at  4:32 PM Priority: Routine <No visit type provided>  Department: [facility]  Provider:   Appointment Notes:  Orders were entered for feraheme but insurance will only allow venofer.  Please schedule 5 doses of venofer and let pt know.  Scheduling Notes:

## 2021-12-18 ENCOUNTER — Other Ambulatory Visit: Payer: Self-pay | Admitting: Pharmacist

## 2021-12-19 ENCOUNTER — Ambulatory Visit: Payer: BC Managed Care – PPO

## 2021-12-19 MED FILL — Iron Sucrose Inj 20 MG/ML (Fe Equiv): INTRAVENOUS | Qty: 10 | Status: AC

## 2021-12-20 ENCOUNTER — Ambulatory Visit: Payer: BC Managed Care – PPO

## 2021-12-20 ENCOUNTER — Inpatient Hospital Stay: Payer: BC Managed Care – PPO | Attending: Oncology

## 2021-12-20 VITALS — BP 147/65 | HR 88 | Temp 98.1°F | Resp 18 | Ht 64.5 in | Wt 204.0 lb

## 2021-12-20 DIAGNOSIS — D509 Iron deficiency anemia, unspecified: Secondary | ICD-10-CM | POA: Diagnosis present

## 2021-12-20 DIAGNOSIS — Z79899 Other long term (current) drug therapy: Secondary | ICD-10-CM | POA: Insufficient documentation

## 2021-12-20 DIAGNOSIS — D508 Other iron deficiency anemias: Secondary | ICD-10-CM

## 2021-12-20 MED ORDER — SODIUM CHLORIDE 0.9 % IV SOLN: Freq: Once | INTRAVENOUS | Status: AC

## 2021-12-20 MED ORDER — SODIUM CHLORIDE 0.9 % IV SOLN
200.0000 mg | Freq: Once | INTRAVENOUS | Status: AC
  Administered 2021-12-20: 200 mg via INTRAVENOUS
  Filled 2021-12-20: qty 200

## 2021-12-20 NOTE — Patient Instructions (Signed)

## 2021-12-21 ENCOUNTER — Inpatient Hospital Stay: Payer: BC Managed Care – PPO

## 2021-12-21 DIAGNOSIS — D509 Iron deficiency anemia, unspecified: Secondary | ICD-10-CM | POA: Diagnosis not present

## 2021-12-21 DIAGNOSIS — D508 Other iron deficiency anemias: Secondary | ICD-10-CM

## 2021-12-21 MED ORDER — SODIUM CHLORIDE 0.9 % IV SOLN: Freq: Once | INTRAVENOUS | Status: AC

## 2021-12-21 MED ORDER — SODIUM CHLORIDE 0.9 % IV SOLN
200.0000 mg | Freq: Once | INTRAVENOUS | Status: AC
  Administered 2021-12-21: 200 mg via INTRAVENOUS
  Filled 2021-12-21: qty 200

## 2021-12-21 NOTE — Patient Instructions (Signed)

## 2021-12-24 ENCOUNTER — Inpatient Hospital Stay: Payer: BC Managed Care – PPO

## 2021-12-24 VITALS — BP 107/67 | HR 71 | Temp 98.4°F | Resp 18 | Ht 64.5 in | Wt 203.0 lb

## 2021-12-24 DIAGNOSIS — D509 Iron deficiency anemia, unspecified: Secondary | ICD-10-CM | POA: Diagnosis not present

## 2021-12-24 DIAGNOSIS — D508 Other iron deficiency anemias: Secondary | ICD-10-CM

## 2021-12-24 MED ORDER — SODIUM CHLORIDE 0.9 % IV SOLN: Freq: Once | INTRAVENOUS | Status: AC

## 2021-12-24 MED ORDER — SODIUM CHLORIDE 0.9 % IV SOLN
200.0000 mg | Freq: Once | INTRAVENOUS | Status: AC
  Administered 2021-12-24: 200 mg via INTRAVENOUS
  Filled 2021-12-24: qty 200

## 2021-12-24 NOTE — Patient Instructions (Signed)

## 2021-12-25 ENCOUNTER — Inpatient Hospital Stay: Payer: BC Managed Care – PPO

## 2021-12-25 VITALS — BP 129/65 | HR 87 | Temp 98.1°F | Resp 18

## 2021-12-25 DIAGNOSIS — D508 Other iron deficiency anemias: Secondary | ICD-10-CM

## 2021-12-25 DIAGNOSIS — D509 Iron deficiency anemia, unspecified: Secondary | ICD-10-CM | POA: Diagnosis not present

## 2021-12-25 MED ORDER — SODIUM CHLORIDE 0.9 % IV SOLN: Freq: Once | INTRAVENOUS | Status: AC

## 2021-12-25 MED ORDER — SODIUM CHLORIDE 0.9 % IV SOLN
200.0000 mg | Freq: Once | INTRAVENOUS | Status: AC
  Administered 2021-12-25: 200 mg via INTRAVENOUS
  Filled 2021-12-25: qty 200

## 2021-12-26 ENCOUNTER — Inpatient Hospital Stay: Payer: BC Managed Care – PPO

## 2021-12-26 VITALS — BP 140/67 | HR 88 | Temp 98.0°F | Resp 18

## 2021-12-26 DIAGNOSIS — D509 Iron deficiency anemia, unspecified: Secondary | ICD-10-CM | POA: Diagnosis not present

## 2021-12-26 DIAGNOSIS — D508 Other iron deficiency anemias: Secondary | ICD-10-CM

## 2021-12-26 MED ORDER — SODIUM CHLORIDE 0.9 % IV SOLN
200.0000 mg | Freq: Once | INTRAVENOUS | Status: AC
  Administered 2021-12-26: 200 mg via INTRAVENOUS
  Filled 2021-12-26: qty 200

## 2021-12-26 MED ORDER — SODIUM CHLORIDE 0.9 % IV SOLN: Freq: Once | INTRAVENOUS | Status: AC

## 2021-12-26 NOTE — Patient Instructions (Signed)

## 2022-03-10 NOTE — Progress Notes (Unsigned)
[facility]  [address] [place],  [place]  [address] [phone]  Clinic Day:  03/11/2022  Referring physician: Ailene Ravel, MD   HISTORY OF PRESENT ILLNESS:  The patient is a 66 y.o. female  with iron deficiency anemia.  She comes in today to reassess her iron and hemoglobin levels after receiving IV iron.  She definitely has noticed an improvement in how she has felt since her IV iron was given.  She continues to deny having any overt forms of blood loss to explain her iron deficiency anemia.    PHYSICAL EXAM:  Blood pressure (!) 157/82, pulse 79, temperature 98.6 F (37 C), resp. rate 16, height 5' 4.5" (1.638 m), weight 197 lb 9.6 oz (89.6 kg), SpO2 95 %. Wt Readings from Last 3 Encounters:  03/11/22 197 lb 9.6 oz (89.6 kg)  12/24/21 203 lb (92.1 kg)  12/20/21 204 lb (92.5 kg)   Body mass index is 33.39 kg/m. Performance status (ECOG): 0 - Asymptomatic Physical Exam Constitutional:      Appearance: Normal appearance. She is not ill-appearing.  HENT:     Mouth/Throat:     Mouth: Mucous membranes are moist.     Pharynx: Oropharynx is clear. No oropharyngeal exudate or posterior oropharyngeal erythema.  Cardiovascular:     Rate and Rhythm: Normal rate and regular rhythm.     Heart sounds: No murmur heard.    No friction rub. No gallop.  Pulmonary:     Effort: Pulmonary effort is normal. No respiratory distress.     Breath sounds: Normal breath sounds. No wheezing, rhonchi or rales.  Abdominal:     General: Bowel sounds are normal. There is no distension.     Palpations: Abdomen is soft. There is no mass.     Tenderness: There is no abdominal tenderness.  Musculoskeletal:        General: No swelling.     Right lower leg: No edema.     Left lower leg: No edema.  Lymphadenopathy:     Cervical: No cervical adenopathy.     Upper Body:     Right upper body: No supraclavicular or axillary adenopathy.     Left upper body: No  supraclavicular or axillary adenopathy.     Lower Body: No right inguinal adenopathy. No left inguinal adenopathy.  Skin:    General: Skin is warm.     Coloration: Skin is not jaundiced.     Findings: No lesion or rash.  Neurological:     General: No focal deficit present.     Mental Status: She is alert and oriented to person, place, and time. Mental status is at baseline.  Psychiatric:        Mood and Affect: Mood normal.        Behavior: Behavior normal.        Thought Content: Thought content normal.    LABS:    Latest Reference Range & Units 12/06/21 15:02 03/11/22 13:47  Iron 28 - 170 ug/dL 97 88  UIBC ug/dL 188 416  TIBC 606 - 301 ug/dL 601 (H) 093  Saturation Ratios 10.4 - 31.8 % 20 24  Ferritin 11 - 307 ng/mL 10 (L) 76  (H): Data is abnormally high (L): Data is abnormally low  ASSESSMENT & PLAN:  A 66 y.o. female with iron deficiency anemia.  I am pleased as her iron and hemoglobin levels have significantly improved since receiving her IV iron.  The patient was pleased  with her lab results today.  I still want her to be seen by GI to undergo the necessary workup to ensure her iron deficiency anemia is not due to any occult GI pathology.  The patient claims she will get in contact with her GI doctor to have this done.  Otherwise, I will see her back in 6 months for repeat clinical assessment.  The patient understands all the plans discussed today and is in agreement with them.  Sophia Adams Kirby Funk, MD

## 2022-03-11 ENCOUNTER — Inpatient Hospital Stay: Payer: BC Managed Care – PPO

## 2022-03-11 ENCOUNTER — Other Ambulatory Visit: Payer: Self-pay | Admitting: Oncology

## 2022-03-11 ENCOUNTER — Telehealth: Payer: Self-pay | Admitting: Oncology

## 2022-03-11 ENCOUNTER — Inpatient Hospital Stay: Payer: BC Managed Care – PPO | Attending: Oncology | Admitting: Oncology

## 2022-03-11 VITALS — BP 157/82 | HR 79 | Temp 98.6°F | Resp 16 | Ht 64.5 in | Wt 197.6 lb

## 2022-03-11 DIAGNOSIS — D508 Other iron deficiency anemias: Secondary | ICD-10-CM

## 2022-03-11 DIAGNOSIS — D509 Iron deficiency anemia, unspecified: Secondary | ICD-10-CM | POA: Diagnosis not present

## 2022-03-11 DIAGNOSIS — Z79899 Other long term (current) drug therapy: Secondary | ICD-10-CM | POA: Diagnosis not present

## 2022-03-11 LAB — CBC AND DIFFERENTIAL
HCT: 37 (ref 36–46)
Hemoglobin: 12.7 (ref 12.0–16.0)
Neutrophils Absolute: 2.75
Platelets: 240 10*3/uL (ref 150–400)
WBC: 5.1

## 2022-03-11 LAB — IRON AND TIBC
Iron: 88 ug/dL (ref 28–170)
Saturation Ratios: 24 % (ref 10.4–31.8)
TIBC: 365 ug/dL (ref 250–450)
UIBC: 277 ug/dL

## 2022-03-11 LAB — FERRITIN: Ferritin: 76 ng/mL (ref 11–307)

## 2022-03-11 LAB — CBC: RBC: 4.55 (ref 3.87–5.11)

## 2022-03-11 NOTE — Telephone Encounter (Signed)
Patient has been scheduled for follow-up visit per 03/11/22 los. Pt given an appt calendar with date and time.  

## 2022-03-14 ENCOUNTER — Encounter: Payer: Self-pay | Admitting: Oncology

## 2022-06-20 ENCOUNTER — Encounter: Payer: Self-pay | Admitting: Radiology

## 2022-09-09 NOTE — Progress Notes (Unsigned)
[facility]  [address] [place],  [place]  [address] [phone]  Clinic Day:  03/11/2022  Referring physician: Ailene Ravel, MD   HISTORY OF PRESENT ILLNESS:  The patient is a 67 y.o. female  with iron deficiency anemia.  She comes in today to reassess her iron and hemoglobin levels.  In the past, IV iron was effective in improving these values.  Of note, since her last visit, she underwent a colonoscopy and EGD at [facility], for which a hiatal hernia was seen.  She claims no significant GI pathology was appreciated.  Overall, she claims to be doing well.  She continues to deny having any overt forms of blood loss to explain her previous iron deficiency anemia.    PHYSICAL EXAM:  Blood pressure (!) 157/74, pulse 72, temperature 98.2 F (36.8 C), resp. rate 16, height 5' 4.5" (1.638 m), weight 192 lb 1.6 oz (87.1 kg), SpO2 100 %. Wt Readings from Last 3 Encounters:  09/10/22 192 lb 1.6 oz (87.1 kg)  03/11/22 197 lb 9.6 oz (89.6 kg)  12/24/21 203 lb (92.1 kg)   Body mass index is 32.46 kg/m. Performance status (ECOG): 0 - Asymptomatic Physical Exam Constitutional:      Appearance: Normal appearance. She is not ill-appearing.  HENT:     Mouth/Throat:     Mouth: Mucous membranes are moist.     Pharynx: Oropharynx is clear. No oropharyngeal exudate or posterior oropharyngeal erythema.  Cardiovascular:     Rate and Rhythm: Normal rate and regular rhythm.     Heart sounds: No murmur heard.    No friction rub. No gallop.  Pulmonary:     Effort: Pulmonary effort is normal. No respiratory distress.     Breath sounds: Normal breath sounds. No wheezing, rhonchi or rales.  Abdominal:     General: Bowel sounds are normal. There is no distension.     Palpations: Abdomen is soft. There is no mass.     Tenderness: There is no abdominal tenderness.  Musculoskeletal:        General: No swelling.     Right lower leg: No edema.     Left  lower leg: No edema.  Lymphadenopathy:     Cervical: No cervical adenopathy.     Upper Body:     Right upper body: No supraclavicular or axillary adenopathy.     Left upper body: No supraclavicular or axillary adenopathy.     Lower Body: No right inguinal adenopathy. No left inguinal adenopathy.  Skin:    General: Skin is warm.     Coloration: Skin is not jaundiced.     Findings: No lesion or rash.  Neurological:     General: No focal deficit present.     Mental Status: She is alert and oriented to person, place, and time. Mental status is at baseline.  Psychiatric:        Mood and Affect: Mood normal.        Behavior: Behavior normal.        Thought Content: Thought content normal.   LABS:     Latest Reference Range & Units 09/10/22 12:21  Iron 28 - 170 ug/dL 98  UIBC ug/dL 914  TIBC 782 - 956 ug/dL 213  Saturation Ratios 10.4 - 31.8 % 28  Ferritin 11 - 307 ng/mL 65   ASSESSMENT & PLAN:  A 67 y.o. female with a history of iron deficiency anemia.  I am pleased as  her iron and hemoglobin levels remain normal.  As mentioned previously, no adverse GI tract pathology was appreciated with her recent GI workup.  Clinically, the patient appears to be doing very well.  As that is the case, I will see her back in 1 year for repeat clinical assessment.  The patient understands all the plans discussed today and is in agreement with them.  Laverne Hursey Kirby Funk, MD

## 2022-09-10 ENCOUNTER — Inpatient Hospital Stay: Payer: BC Managed Care – PPO | Attending: Oncology

## 2022-09-10 ENCOUNTER — Other Ambulatory Visit: Payer: Self-pay | Admitting: Oncology

## 2022-09-10 ENCOUNTER — Inpatient Hospital Stay: Payer: BC Managed Care – PPO | Admitting: Oncology

## 2022-09-10 VITALS — BP 157/74 | HR 72 | Temp 98.2°F | Resp 16 | Ht 64.5 in | Wt 192.1 lb

## 2022-09-10 DIAGNOSIS — D508 Other iron deficiency anemias: Secondary | ICD-10-CM

## 2022-09-10 DIAGNOSIS — D509 Iron deficiency anemia, unspecified: Secondary | ICD-10-CM | POA: Diagnosis present

## 2022-09-10 LAB — CBC AND DIFFERENTIAL
HCT: 37 (ref 36–46)
Hemoglobin: 12.5 (ref 12.0–16.0)
Neutrophils Absolute: 2.86
Platelets: 235 10*3/uL (ref 150–400)
WBC: 5.1

## 2022-09-10 LAB — CBC: RBC: 4.29 (ref 3.87–5.11)

## 2022-09-10 LAB — IRON AND TIBC
Iron: 98 ug/dL (ref 28–170)
Saturation Ratios: 28 % (ref 10.4–31.8)
TIBC: 347 ug/dL (ref 250–450)
UIBC: 249 ug/dL

## 2022-09-10 LAB — FERRITIN: Ferritin: 65 ng/mL (ref 11–307)

## 2022-09-17 ENCOUNTER — Telehealth: Payer: Self-pay | Admitting: Oncology

## 2022-09-17 NOTE — Telephone Encounter (Signed)
09/17/22 LVM next appt scheduled

## 2022-11-04 ENCOUNTER — Encounter: Payer: Self-pay | Admitting: Oncology

## 2022-11-04 ENCOUNTER — Other Ambulatory Visit: Payer: Self-pay | Admitting: Family Medicine

## 2022-11-04 DIAGNOSIS — Z1231 Encounter for screening mammogram for malignant neoplasm of breast: Secondary | ICD-10-CM

## 2022-11-05 ENCOUNTER — Ambulatory Visit: Payer: BC Managed Care – PPO

## 2022-11-06 ENCOUNTER — Encounter: Payer: Self-pay | Admitting: Oncology

## 2022-11-06 ENCOUNTER — Ambulatory Visit
Admission: RE | Admit: 2022-11-06 | Discharge: 2022-11-06 | Disposition: A | Discharge status: Home / Self Care | Payer: Self-pay | Source: Ambulatory Visit | Attending: Family Medicine | Admitting: Family Medicine

## 2022-11-06 DIAGNOSIS — Z1231 Encounter for screening mammogram for malignant neoplasm of breast: Secondary | ICD-10-CM

## 2022-12-20 IMAGING — MG MM DIGITAL SCREENING BILAT W/ TOMO AND CAD
8 series · 8 of 24 positions shown · non-contrast
Comparison: Previous exam(s).

CLINICAL DATA: Screening.

EXAM:
DIGITAL SCREENING BILATERAL MAMMOGRAM WITH TOMOSYNTHESIS AND CAD
TECHNIQUE: Bilateral screening digital craniocaudal and mediolateral oblique
mammograms were obtained. Bilateral screening digital breast
tomosynthesis was performed. The images were evaluated with
computer-aided detection.

[L MLO synth-2D]
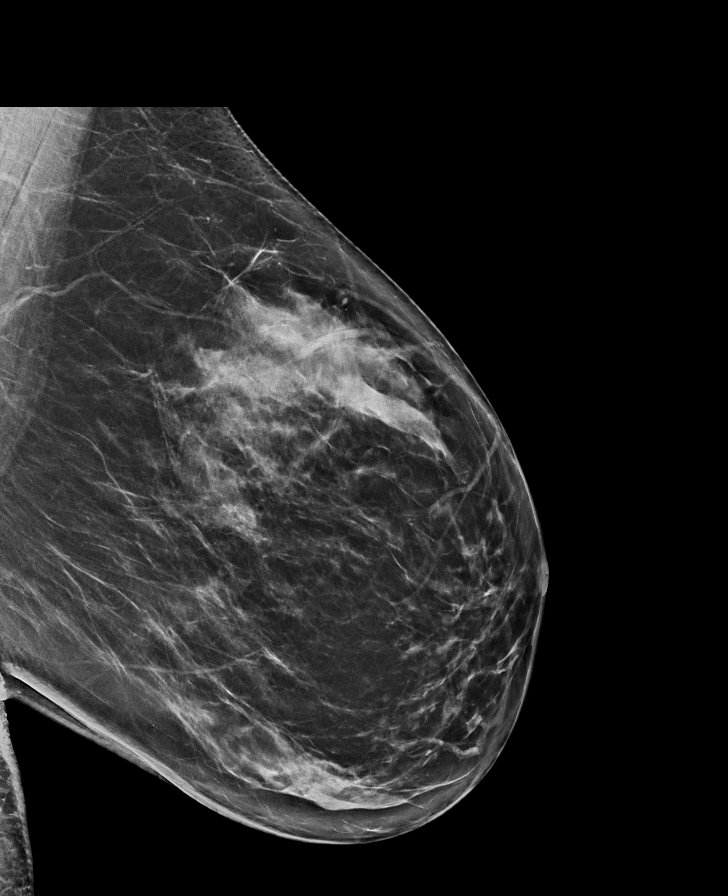

[L CC synth-2D]
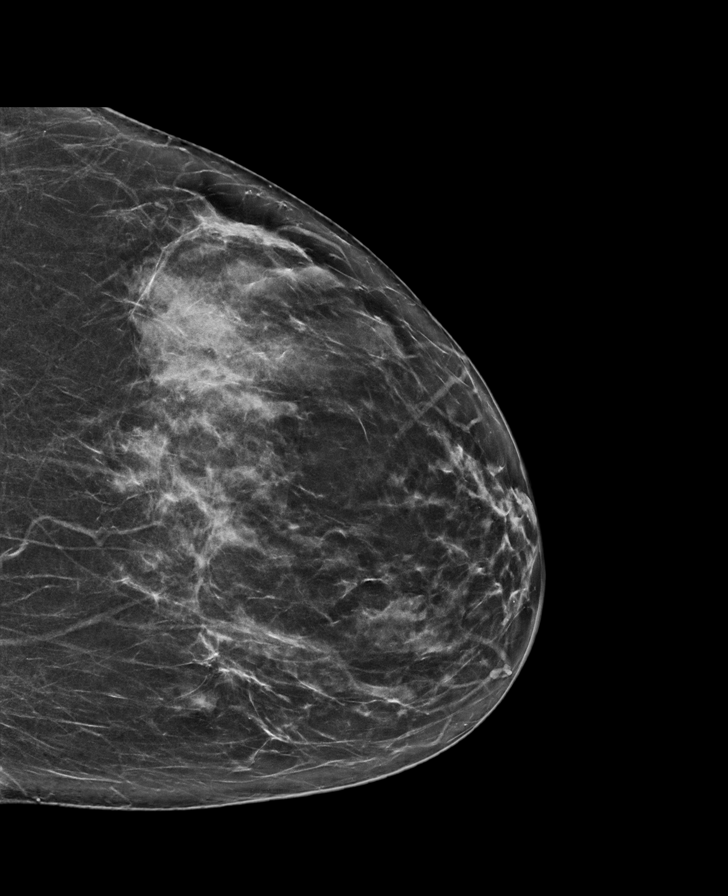

[R MLO synth-2D]
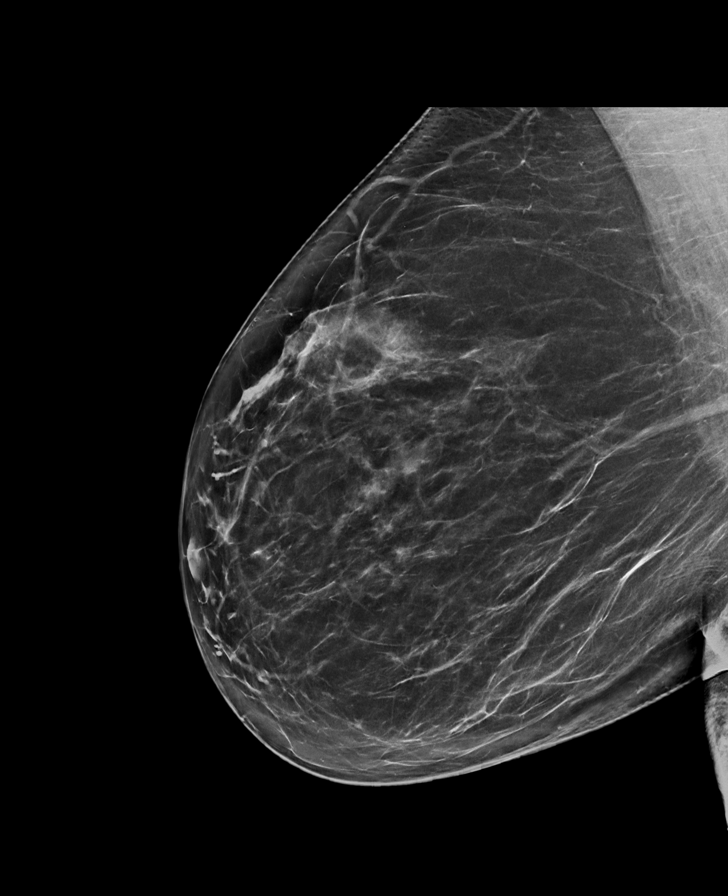

[R CC synth-2D]
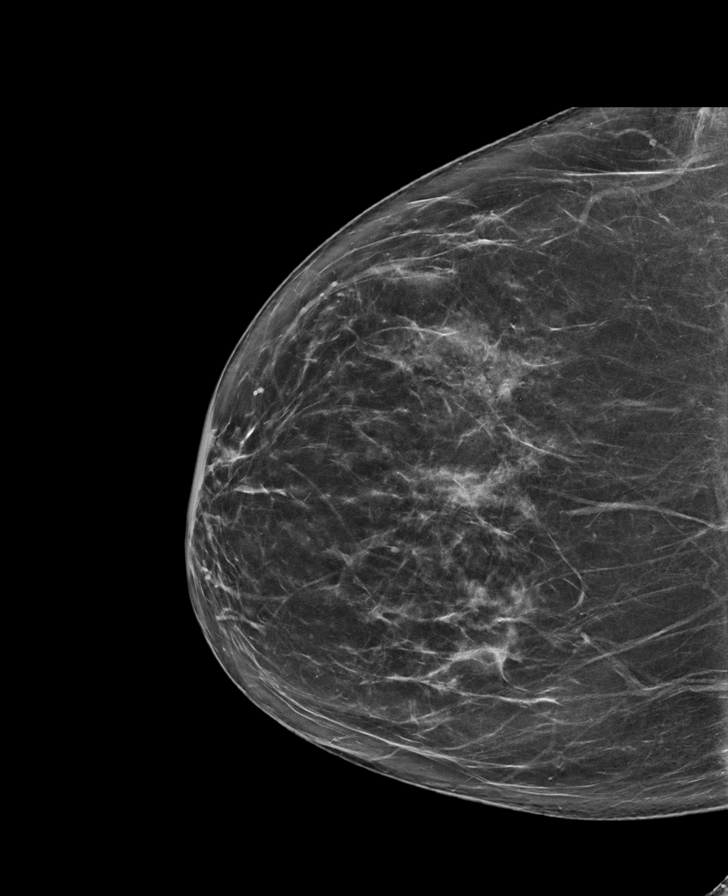

[R MLO tomo · tomo slice 43/85.0]
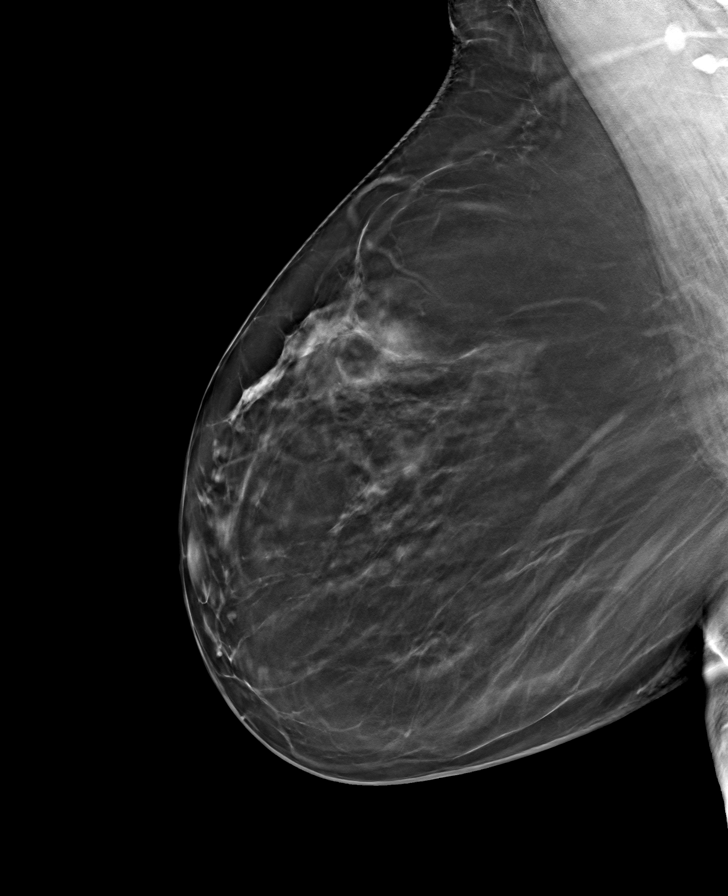

[L CC tomo · tomo slice 35/70.0]
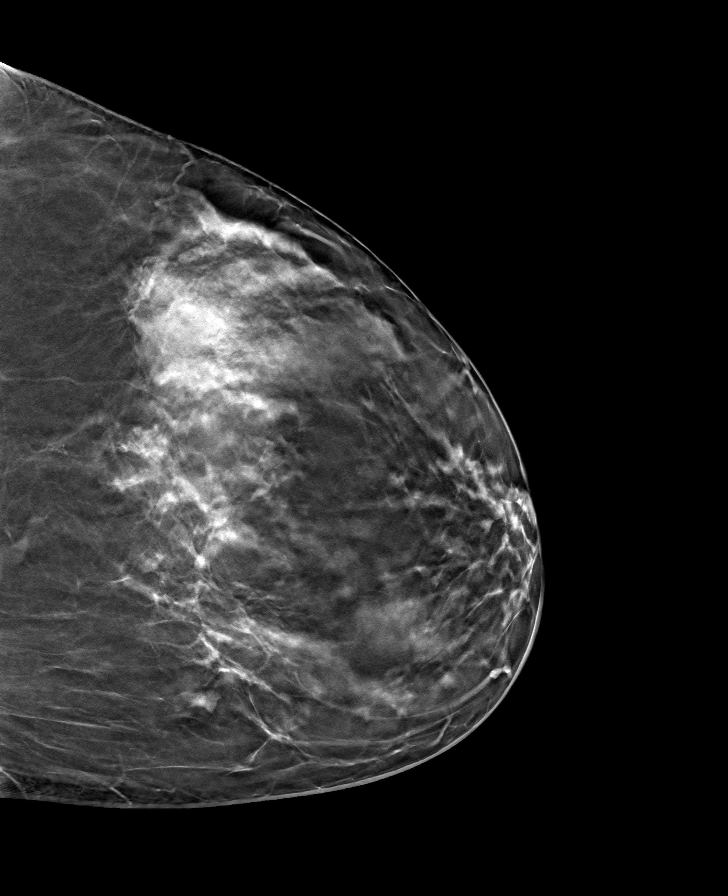

[R CC tomo · tomo slice 39/76.0]
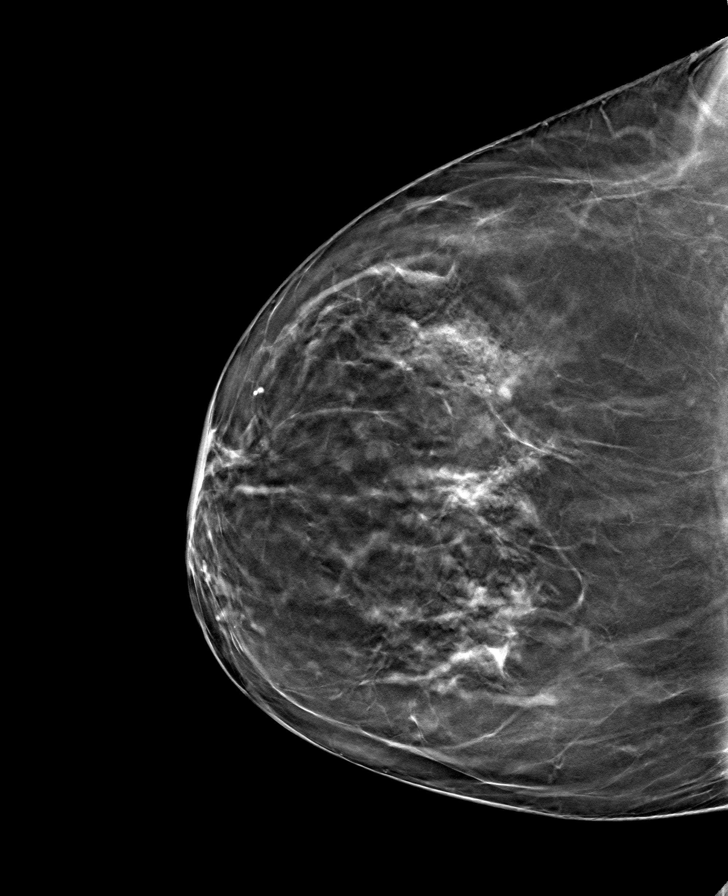

[L MLO tomo · tomo slice 39/76.0]
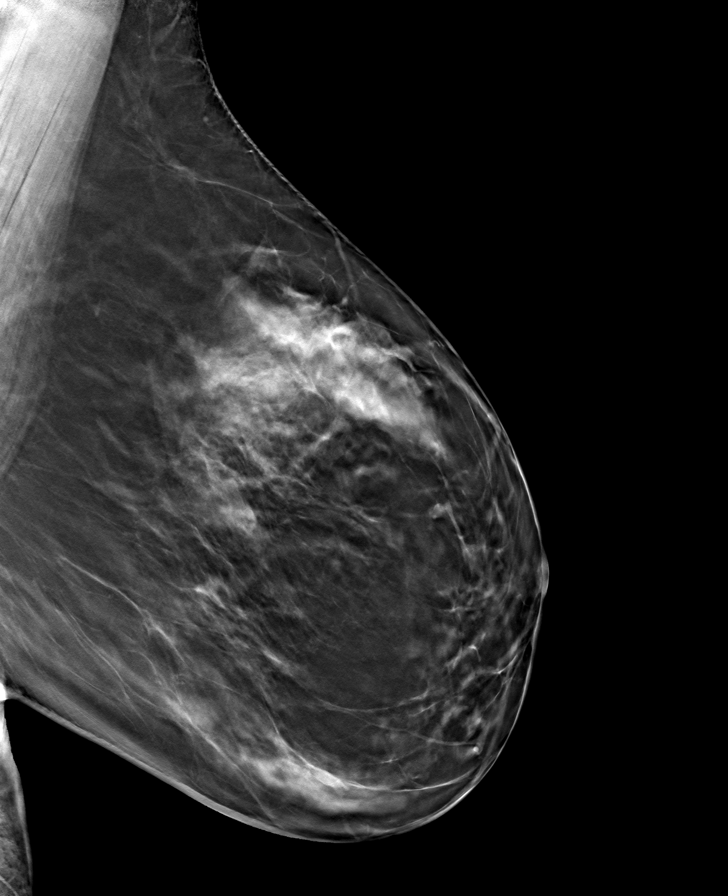

[8 of 24 positions shown; findings below may reference images not displayed]

ACR Breast Density Category b: There are scattered areas of
fibroglandular density.
FINDINGS: There are no findings suspicious for malignancy.
IMPRESSION: No mammographic evidence of malignancy. A result letter of this
screening mammogram will be mailed directly to the patient.

RECOMMENDATION:
Screening mammogram in one year. (Code:51-O-LD2)

BI-RADS CATEGORY  1: Negative.

## 2023-03-26 ENCOUNTER — Other Ambulatory Visit: Payer: Self-pay | Admitting: Family Medicine

## 2023-03-26 DIAGNOSIS — E2839 Other primary ovarian failure: Secondary | ICD-10-CM

## 2023-03-26 DIAGNOSIS — Z1231 Encounter for screening mammogram for malignant neoplasm of breast: Secondary | ICD-10-CM

## 2023-05-27 IMAGING — DX DG KNEE 1-2V PORT*L*
2 series · 2 of 2 positions shown · non-contrast
Comparison: None.

CLINICAL DATA: Knee surgery

EXAM:
PORTABLE LEFT KNEE - 1-2 VIEW

[knee ap]
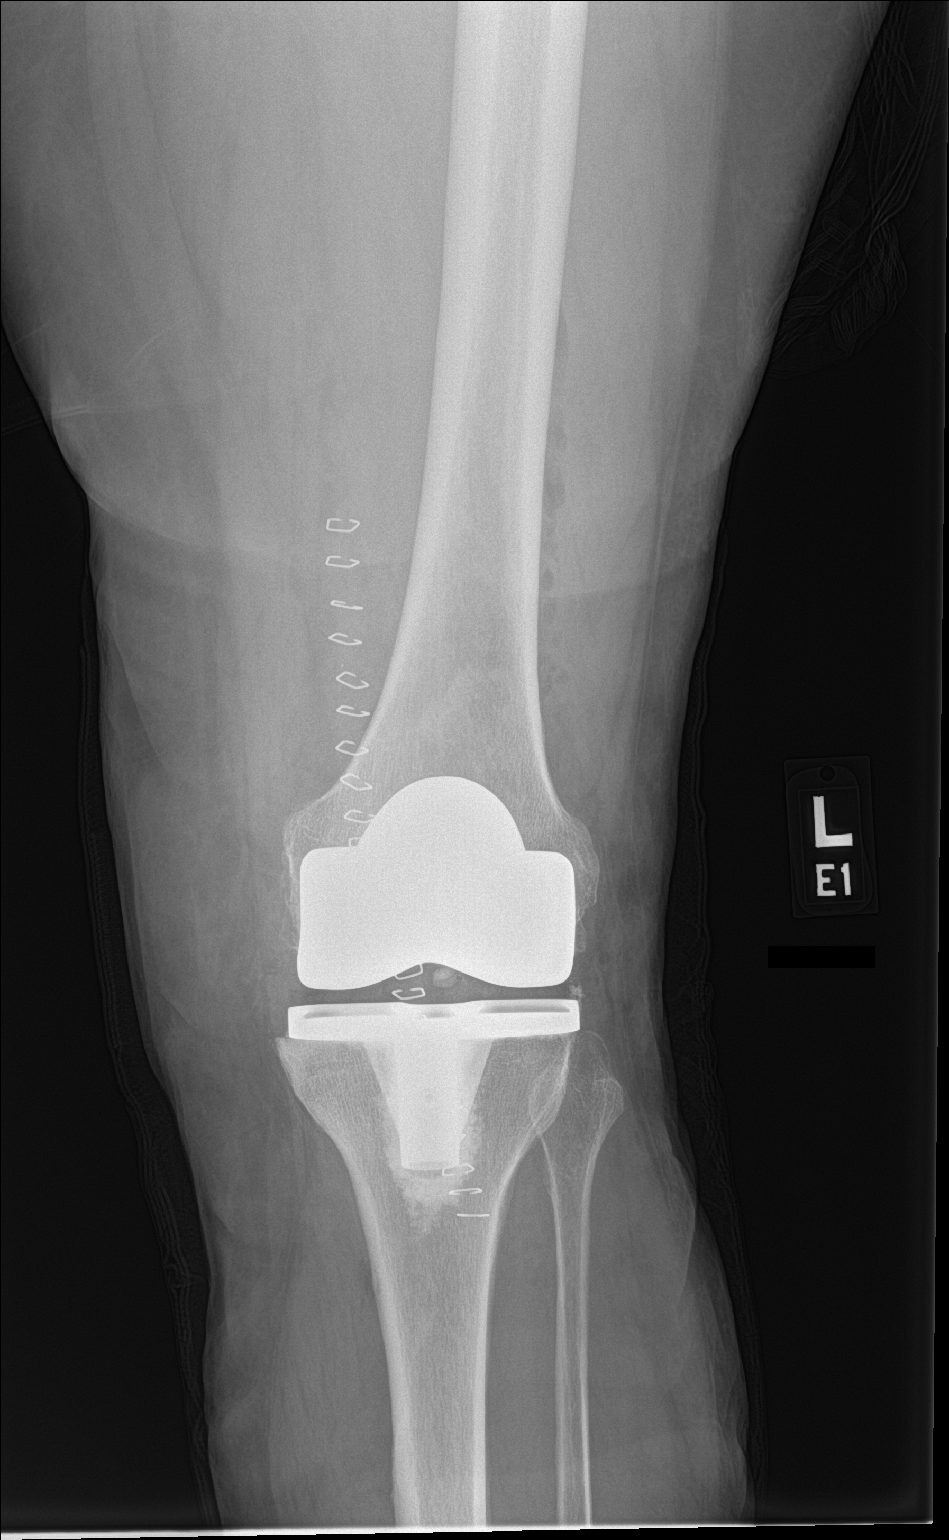

[knee lat]
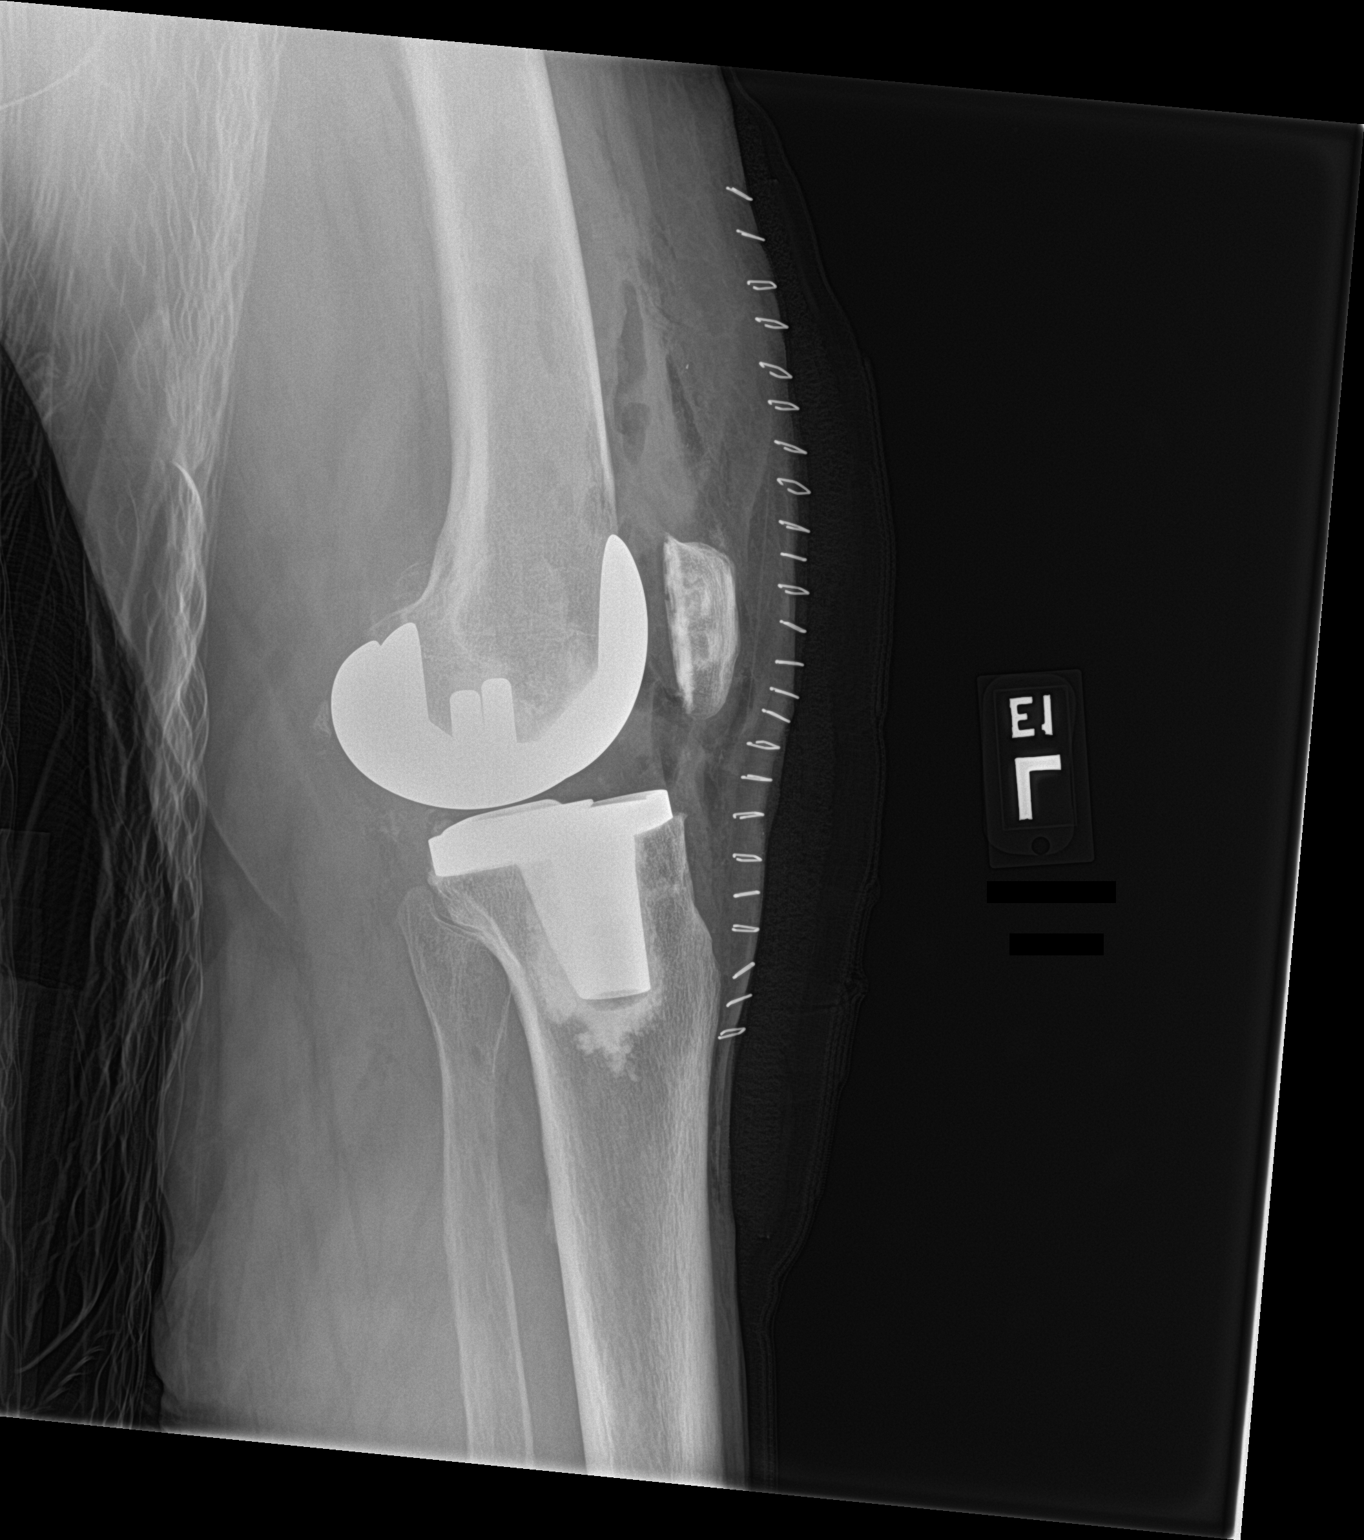

[2 of 2 positions shown; findings below may reference images not displayed]

FINDINGS: LEFT total knee arthroplasty. Prosthetic components appear well
seated. Expected postsurgical soft tissue changes in the anterior
knee.
IMPRESSION: No complication following total knee arthroplasty

## 2023-09-09 NOTE — Progress Notes (Unsigned)
 [facility]  [address] [place],  [place]  [address] [phone]  Clinic Day:  09/10/2023  Referring physician: Annette Barters, MD   HISTORY OF PRESENT ILLNESS:  The patient is a 68 y.o. female  with iron  deficiency anemia.  She comes in today to reassess her iron  and hemoglobin levels.  In the past, IV iron  was effective in improving these values.  Her last iron  infusion was in September 2023.   Since her last visit, the patient has been doing very well.  She denies having increased fatigue or any overt forms of blood loss which concern her for recurrent iron  deficiency anemia.  PHYSICAL EXAM:  Blood pressure (!) 155/88, pulse 66, temperature 98 F (36.7 C), temperature source Oral, resp. rate 18, height 5' 4.5" (1.638 m), weight 193 lb 1.6 oz (87.6 kg), SpO2 98%. Wt Readings from Last 3 Encounters:  09/10/23 193 lb 1.6 oz (87.6 kg)  09/10/22 192 lb 1.6 oz (87.1 kg)  03/11/22 197 lb 9.6 oz (89.6 kg)   Body mass index is 32.63 kg/m. Performance status (ECOG): 0 - Asymptomatic Physical Exam Constitutional:      Appearance: Normal appearance. She is not ill-appearing.  HENT:     Mouth/Throat:     Mouth: Mucous membranes are moist.     Pharynx: Oropharynx is clear. No oropharyngeal exudate or posterior oropharyngeal erythema.  Cardiovascular:     Rate and Rhythm: Normal rate and regular rhythm.     Heart sounds: No murmur heard.    No friction rub. No gallop.  Pulmonary:     Effort: Pulmonary effort is normal. No respiratory distress.     Breath sounds: Normal breath sounds. No wheezing, rhonchi or rales.  Abdominal:     General: Bowel sounds are normal. There is no distension.     Palpations: Abdomen is soft. There is no mass.     Tenderness: There is no abdominal tenderness.  Musculoskeletal:        General: No swelling.     Right lower leg: No edema.     Left lower leg: No edema.  Lymphadenopathy:     Cervical: No cervical  adenopathy.     Upper Body:     Right upper body: No supraclavicular or axillary adenopathy.     Left upper body: No supraclavicular or axillary adenopathy.     Lower Body: No right inguinal adenopathy. No left inguinal adenopathy.  Skin:    General: Skin is warm.     Coloration: Skin is not jaundiced.     Findings: No lesion or rash.  Neurological:     General: No focal deficit present.     Mental Status: She is alert and oriented to person, place, and time. Mental status is at baseline.  Psychiatric:        Mood and Affect: Mood normal.        Behavior: Behavior normal.        Thought Content: Thought content normal.   LABS:    Latest Reference Range & Units 09/10/23 10:22  WBC 4.0 - 10.5 K/uL 5.2  RBC 3.87 - 5.11 MIL/uL 4.42  Hemoglobin 12.0 - 15.0 g/dL 86.5  HCT 78.4 - 69.6 % 38.8  MCV 80.0 - 100.0 fL 87.8  MCH 26.0 - 34.0 pg 29.0  MCHC 30.0 - 36.0 g/dL 29.5  RDW 28.4 - 13.2 % 12.6  Platelets 150 - 400 K/uL 272  nRBC 0.0 - 0.2 % 0.0  Neutrophils % 56  Lymphocytes % 33  Monocytes Relative % 8  Eosinophil % 2  Basophil % 1  Immature Granulocytes % 0    Latest Reference Range & Units 09/10/23 10:23  Iron  28 - 170 ug/dL 540  UIBC ug/dL 981  TIBC 191 - 478 ug/dL 295  Saturation Ratios 10.4 - 31.8 % 30  Ferritin 11 - 307 ng/mL 28    ASSESSMENT & PLAN:  A 68 y.o. female with a history of iron  deficiency anemia.  I am pleased as her iron  and hemoglobin levels remain normal.  Clinically, the patient appears to be doing very well.  As that is the case, I do feel comfortable turning her care back over to her primary care office, with the recommendation that her CBC be checked, at most, 1-2 times per year.  I would not have a problem seeing her in the future if new hematologic issues arise that require repeat clinical assessment.  The patient understands all the plans discussed today and is in agreement with them.  Casondra Gasca Felicia Horde, MD

## 2023-09-10 ENCOUNTER — Other Ambulatory Visit: Payer: Self-pay

## 2023-09-10 ENCOUNTER — Inpatient Hospital Stay: Payer: BC Managed Care – PPO | Attending: Oncology | Admitting: Oncology

## 2023-09-10 ENCOUNTER — Inpatient Hospital Stay: Payer: BC Managed Care – PPO

## 2023-09-10 VITALS — BP 155/88 | HR 66 | Temp 98.0°F | Resp 18 | Ht 64.5 in | Wt 193.1 lb

## 2023-09-10 DIAGNOSIS — D509 Iron deficiency anemia, unspecified: Secondary | ICD-10-CM | POA: Insufficient documentation

## 2023-09-10 DIAGNOSIS — D508 Other iron deficiency anemias: Secondary | ICD-10-CM | POA: Diagnosis not present

## 2023-09-10 LAB — IRON AND TIBC
Iron: 127 ug/dL (ref 28–170)
Saturation Ratios: 30 % (ref 10.4–31.8)
TIBC: 420 ug/dL (ref 250–450)
UIBC: 293 ug/dL

## 2023-09-10 LAB — CBC WITH DIFFERENTIAL (CANCER CENTER ONLY)
Abs Immature Granulocytes: 0.01 10*3/uL (ref 0.00–0.07)
Basophils Absolute: 0 10*3/uL (ref 0.0–0.1)
Basophils Relative: 1 %
Eosinophils Absolute: 0.1 10*3/uL (ref 0.0–0.5)
Eosinophils Relative: 2 %
HCT: 38.8 % (ref 36.0–46.0)
Hemoglobin: 12.8 g/dL (ref 12.0–15.0)
Immature Granulocytes: 0 %
Lymphocytes Relative: 33 %
Lymphs Abs: 1.7 10*3/uL (ref 0.7–4.0)
MCH: 29 pg (ref 26.0–34.0)
MCHC: 33 g/dL (ref 30.0–36.0)
MCV: 87.8 fL (ref 80.0–100.0)
Monocytes Absolute: 0.4 10*3/uL (ref 0.1–1.0)
Monocytes Relative: 8 %
Neutro Abs: 2.9 10*3/uL (ref 1.7–7.7)
Neutrophils Relative %: 56 %
Platelet Count: 272 10*3/uL (ref 150–400)
RBC: 4.42 MIL/uL (ref 3.87–5.11)
RDW: 12.6 % (ref 11.5–15.5)
WBC Count: 5.2 10*3/uL (ref 4.0–10.5)
nRBC: 0 % (ref 0.0–0.2)

## 2023-09-10 LAB — FERRITIN: Ferritin: 28 ng/mL (ref 11–307)

## 2023-11-17 ENCOUNTER — Other Ambulatory Visit: Payer: Medicare PPO

## 2023-11-17 ENCOUNTER — Ambulatory Visit
Admission: RE | Admit: 2023-11-17 | Discharge: 2023-11-17 | Disposition: A | Discharge status: Home / Self Care | Payer: Medicare PPO | Source: Ambulatory Visit | Attending: Family Medicine | Admitting: Family Medicine

## 2023-11-17 DIAGNOSIS — Z1231 Encounter for screening mammogram for malignant neoplasm of breast: Secondary | ICD-10-CM

## 2023-12-19 DIAGNOSIS — E782 Mixed hyperlipidemia: Secondary | ICD-10-CM | POA: Diagnosis not present

## 2023-12-19 DIAGNOSIS — E1169 Type 2 diabetes mellitus with other specified complication: Secondary | ICD-10-CM | POA: Diagnosis not present

## 2023-12-19 DIAGNOSIS — E039 Hypothyroidism, unspecified: Secondary | ICD-10-CM | POA: Diagnosis not present

## 2023-12-19 DIAGNOSIS — Z79899 Other long term (current) drug therapy: Secondary | ICD-10-CM | POA: Diagnosis not present

## 2023-12-24 DIAGNOSIS — L989 Disorder of the skin and subcutaneous tissue, unspecified: Secondary | ICD-10-CM | POA: Diagnosis not present

## 2023-12-24 DIAGNOSIS — Z862 Personal history of diseases of the blood and blood-forming organs and certain disorders involving the immune mechanism: Secondary | ICD-10-CM | POA: Diagnosis not present

## 2023-12-24 DIAGNOSIS — E039 Hypothyroidism, unspecified: Secondary | ICD-10-CM | POA: Diagnosis not present

## 2023-12-24 DIAGNOSIS — I10 Essential (primary) hypertension: Secondary | ICD-10-CM | POA: Diagnosis not present

## 2023-12-24 DIAGNOSIS — E782 Mixed hyperlipidemia: Secondary | ICD-10-CM | POA: Diagnosis not present

## 2023-12-24 DIAGNOSIS — K2100 Gastro-esophageal reflux disease with esophagitis, without bleeding: Secondary | ICD-10-CM | POA: Diagnosis not present

## 2023-12-24 DIAGNOSIS — E66811 Obesity, class 1: Secondary | ICD-10-CM | POA: Diagnosis not present

## 2023-12-24 DIAGNOSIS — E1169 Type 2 diabetes mellitus with other specified complication: Secondary | ICD-10-CM | POA: Diagnosis not present

## 2023-12-24 DIAGNOSIS — Z1331 Encounter for screening for depression: Secondary | ICD-10-CM | POA: Diagnosis not present

## 2024-01-01 ENCOUNTER — Ambulatory Visit (HOSPITAL_BASED_OUTPATIENT_CLINIC_OR_DEPARTMENT_OTHER)
Admission: RE | Admit: 2024-01-01 | Discharge: 2024-01-01 | Disposition: A | Discharge status: Home / Self Care | Source: Ambulatory Visit | Attending: Family Medicine | Admitting: Family Medicine

## 2024-01-01 DIAGNOSIS — E2839 Other primary ovarian failure: Secondary | ICD-10-CM | POA: Diagnosis not present

## 2024-01-01 DIAGNOSIS — Z780 Asymptomatic menopausal state: Secondary | ICD-10-CM | POA: Diagnosis not present
